# Patient Record
Sex: Female | Born: 1970 | Race: White | Hispanic: No | Smoking: Never smoker
Health system: Southern US, Community
[De-identification: ages and names within clinical notes are randomized; demographics above are authoritative.]

## PROBLEM LIST (undated history)

## (undated) DIAGNOSIS — T4145XA Adverse effect of unspecified anesthetic, initial encounter: Secondary | ICD-10-CM

## (undated) DIAGNOSIS — D219 Benign neoplasm of connective and other soft tissue, unspecified: Secondary | ICD-10-CM

## (undated) DIAGNOSIS — M353 Polymyalgia rheumatica: Secondary | ICD-10-CM

## (undated) DIAGNOSIS — R8789 Other abnormal findings in specimens from female genital organs: Secondary | ICD-10-CM

## (undated) DIAGNOSIS — T8859XA Other complications of anesthesia, initial encounter: Secondary | ICD-10-CM

## (undated) HISTORY — PX: BREAST EXCISIONAL BIOPSY: SUR124

## (undated) HISTORY — PX: AUGMENTATION MAMMAPLASTY: SUR837

## (undated) HISTORY — DX: Other abnormal findings in specimens from female genital organs: R87.89

## (undated) HISTORY — PX: OTHER SURGICAL HISTORY: SHX169

## (undated) HISTORY — DX: Benign neoplasm of connective and other soft tissue, unspecified: D21.9

## (undated) HISTORY — PX: LIPOSUCTION: SHX10

---

## 2010-07-28 ENCOUNTER — Emergency Department (HOSPITAL_COMMUNITY)
Admission: EM | Admit: 2010-07-28 | Discharge: 2010-07-29 | Payer: Self-pay | Source: Home / Self Care | Admitting: Emergency Medicine

## 2010-07-28 LAB — CBC
Hemoglobin: 12.4 g/dL (ref 12.0–15.0)
MCH: 29.3 pg (ref 26.0–34.0)
MCHC: 32.5 g/dL (ref 30.0–36.0)
MCV: 90.1 fL (ref 78.0–100.0)
Platelets: 194 10*3/uL (ref 150–400)
RDW: 12.6 % (ref 11.5–15.5)
WBC: 9.2 10*3/uL (ref 4.0–10.5)

## 2010-07-28 LAB — DIFFERENTIAL
Basophils Absolute: 0 10*3/uL (ref 0.0–0.1)
Lymphocytes Relative: 7 % — ABNORMAL LOW (ref 12–46)
Lymphs Abs: 0.7 10*3/uL (ref 0.7–4.0)
Monocytes Absolute: 0.4 10*3/uL (ref 0.1–1.0)
Neutro Abs: 8.1 10*3/uL — ABNORMAL HIGH (ref 1.7–7.7)

## 2010-07-29 LAB — COMPREHENSIVE METABOLIC PANEL
Albumin: 3.1 g/dL — ABNORMAL LOW (ref 3.5–5.2)
BUN: 10 mg/dL (ref 6–23)
Calcium: 7.9 mg/dL — ABNORMAL LOW (ref 8.4–10.5)
Creatinine, Ser: 0.76 mg/dL (ref 0.4–1.2)
Glucose, Bld: 111 mg/dL — ABNORMAL HIGH (ref 70–99)
Potassium: 3.1 mEq/L — ABNORMAL LOW (ref 3.5–5.1)
Total Protein: 6.2 g/dL (ref 6.0–8.3)

## 2010-07-29 LAB — URINALYSIS, ROUTINE W REFLEX MICROSCOPIC
Ketones, ur: >80 mg/dL — AB
Leukocytes, UA: NEGATIVE
Nitrite: NEGATIVE
Specific Gravity, Urine: 1.021 (ref 1.005–1.030)
Urine Glucose, Fasting: NEGATIVE mg/dL
pH: 6 (ref 5.0–8.0)

## 2010-07-29 LAB — LACTIC ACID, PLASMA: Lactic Acid, Venous: 0.7 mmol/L (ref 0.5–2.2)

## 2010-07-29 LAB — URINE MICROSCOPIC-ADD ON

## 2010-07-29 LAB — POCT PREGNANCY, URINE: Preg Test, Ur: NEGATIVE

## 2010-12-19 ENCOUNTER — Other Ambulatory Visit: Payer: Self-pay | Admitting: Family Medicine

## 2010-12-19 ENCOUNTER — Other Ambulatory Visit (HOSPITAL_COMMUNITY)
Admission: RE | Admit: 2010-12-19 | Discharge: 2010-12-19 | Disposition: A | Discharge status: Home / Self Care | Payer: BC Managed Care – PPO | Source: Ambulatory Visit | Attending: Family Medicine | Admitting: Family Medicine

## 2010-12-19 DIAGNOSIS — Z01419 Encounter for gynecological examination (general) (routine) without abnormal findings: Secondary | ICD-10-CM | POA: Insufficient documentation

## 2010-12-19 DIAGNOSIS — Z1231 Encounter for screening mammogram for malignant neoplasm of breast: Secondary | ICD-10-CM

## 2011-10-25 ENCOUNTER — Ambulatory Visit (INDEPENDENT_AMBULATORY_CARE_PROVIDER_SITE_OTHER): Payer: BC Managed Care – PPO | Admitting: Family Medicine

## 2011-10-25 VITALS — BP 118/80 | HR 82 | Temp 98.1°F | Resp 16 | Ht 63.5 in | Wt 145.8 lb

## 2011-10-25 DIAGNOSIS — N949 Unspecified condition associated with female genital organs and menstrual cycle: Secondary | ICD-10-CM

## 2011-10-25 DIAGNOSIS — N938 Other specified abnormal uterine and vaginal bleeding: Secondary | ICD-10-CM

## 2011-10-25 DIAGNOSIS — Z658 Other specified problems related to psychosocial circumstances: Secondary | ICD-10-CM

## 2011-10-25 DIAGNOSIS — G43829 Menstrual migraine, not intractable, without status migrainosus: Secondary | ICD-10-CM

## 2011-10-25 LAB — POCT WET PREP WITH KOH
KOH Prep POC: NEGATIVE
Yeast Wet Prep HPF POC: NEGATIVE

## 2011-10-25 NOTE — Progress Notes (Signed)
  Subjective:    Patient ID: Lori Richmond, female    DOB: [birth year], 41 y.o.   MRN: [id number]  HPI  Patient complains of irregular menstrual bleeding. Last normal menses was 4/10 which lasted 5 days . April 23rd began to spot with heavy bleeding associated with clots overnight.    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 She appears well-developed.  HENT:  Head: Normocephalic and atraumatic.  Neck: No thyromegaly present.  Cardiovascular: Normal rate, regular rhythm and normal heart sounds.   Pulmonary/Chest: Effort normal and breath sounds normal.  Abdominal: Soft. Bowel sounds are normal. She exhibits no mass.  Genitourinary: Vagina normal and uterus normal. No vaginal discharge found.  Neurological: She is alert and oriented to person, place, and time. She has normal strength. She displays normal reflexes. No cranial nerve deficit or sensory deficit. Coordination normal.    Results for orders placed in visit on 10/25/11  POCT WET PREP WITH KOH      Component Value Range   Trichomonas, UA Negative     Clue Cells Wet Prep HPF POC 0-1     Epithelial Wet Prep HPF POC 3-10     Yeast Wet Prep HPF POC neg     Bacteria Wet Prep HPF POC 2+     RBC Wet Prep HPF POC 0-4     WBC Wet Prep HPF POC 0-5     KOH Prep POC Negative          Assessment & Plan:   1. DUB (dysfunctional uterine bleeding)  POCT Wet Prep with KOH  2. Migraine, menstrual    3. Psychosocial stressors      Anticipatory guidance Supportive counseling Patient would like to  discontinue OCP upon completion of currnet pack. If irregular bleeding pattern continues patient to see her GYN

## 2011-10-28 ENCOUNTER — Encounter: Payer: Self-pay | Admitting: Family Medicine

## 2012-02-20 ENCOUNTER — Other Ambulatory Visit (HOSPITAL_COMMUNITY)
Admission: RE | Admit: 2012-02-20 | Discharge: 2012-02-20 | Disposition: A | Discharge status: Home / Self Care | Payer: BC Managed Care – PPO | Source: Ambulatory Visit | Attending: Family Medicine | Admitting: Family Medicine

## 2012-02-20 DIAGNOSIS — Z0000 Encounter for general adult medical examination without abnormal findings: Secondary | ICD-10-CM | POA: Insufficient documentation

## 2013-02-21 ENCOUNTER — Other Ambulatory Visit: Payer: Self-pay | Admitting: Family Medicine

## 2013-02-21 ENCOUNTER — Other Ambulatory Visit (HOSPITAL_COMMUNITY)
Admission: RE | Admit: 2013-02-21 | Discharge: 2013-02-21 | Disposition: A | Discharge status: Home / Self Care | Payer: BC Managed Care – PPO | Source: Ambulatory Visit | Attending: Family Medicine | Admitting: Family Medicine

## 2013-02-21 DIAGNOSIS — Z0000 Encounter for general adult medical examination without abnormal findings: Secondary | ICD-10-CM | POA: Insufficient documentation

## 2013-02-22 ENCOUNTER — Other Ambulatory Visit: Payer: Self-pay | Admitting: Family Medicine

## 2013-02-22 DIAGNOSIS — Z1231 Encounter for screening mammogram for malignant neoplasm of breast: Secondary | ICD-10-CM

## 2013-04-05 ENCOUNTER — Ambulatory Visit
Admission: RE | Admit: 2013-04-05 | Discharge: 2013-04-05 | Disposition: A | Discharge status: Home / Self Care | Payer: BC Managed Care – PPO | Source: Ambulatory Visit | Attending: Family Medicine | Admitting: Family Medicine

## 2013-04-05 DIAGNOSIS — Z1231 Encounter for screening mammogram for malignant neoplasm of breast: Secondary | ICD-10-CM

## 2013-04-19 ENCOUNTER — Other Ambulatory Visit: Payer: Self-pay | Admitting: Family

## 2013-04-19 ENCOUNTER — Other Ambulatory Visit: Payer: Self-pay | Admitting: Family Medicine

## 2013-04-19 ENCOUNTER — Ambulatory Visit
Admission: RE | Admit: 2013-04-19 | Discharge: 2013-04-19 | Disposition: A | Discharge status: Home / Self Care | Payer: BC Managed Care – PPO | Source: Ambulatory Visit | Attending: Family | Admitting: Family

## 2013-04-19 DIAGNOSIS — R928 Other abnormal and inconclusive findings on diagnostic imaging of breast: Secondary | ICD-10-CM

## 2013-04-19 DIAGNOSIS — S99921D Unspecified injury of right foot, subsequent encounter: Secondary | ICD-10-CM

## 2013-05-04 ENCOUNTER — Ambulatory Visit
Admission: RE | Admit: 2013-05-04 | Discharge: 2013-05-04 | Disposition: A | Discharge status: Home / Self Care | Payer: BC Managed Care – PPO | Source: Ambulatory Visit | Attending: Family Medicine | Admitting: Family Medicine

## 2013-05-04 DIAGNOSIS — R928 Other abnormal and inconclusive findings on diagnostic imaging of breast: Secondary | ICD-10-CM

## 2014-03-28 ENCOUNTER — Other Ambulatory Visit (HOSPITAL_COMMUNITY)
Admission: RE | Admit: 2014-03-28 | Discharge: 2014-03-28 | Disposition: A | Discharge status: Home / Self Care | Payer: BC Managed Care – PPO | Source: Ambulatory Visit | Attending: Family Medicine | Admitting: Family Medicine

## 2014-03-28 ENCOUNTER — Other Ambulatory Visit: Payer: Self-pay | Admitting: Family Medicine

## 2014-03-28 DIAGNOSIS — Z1231 Encounter for screening mammogram for malignant neoplasm of breast: Secondary | ICD-10-CM

## 2014-03-28 DIAGNOSIS — Z0000 Encounter for general adult medical examination without abnormal findings: Secondary | ICD-10-CM | POA: Insufficient documentation

## 2014-03-29 LAB — CYTOLOGY - PAP

## 2014-04-06 ENCOUNTER — Ambulatory Visit
Admission: RE | Admit: 2014-04-06 | Discharge: 2014-04-06 | Disposition: A | Discharge status: Home / Self Care | Payer: BC Managed Care – PPO | Source: Ambulatory Visit | Attending: Family Medicine | Admitting: Family Medicine

## 2014-04-06 DIAGNOSIS — Z1231 Encounter for screening mammogram for malignant neoplasm of breast: Secondary | ICD-10-CM

## 2014-04-07 ENCOUNTER — Other Ambulatory Visit: Payer: Self-pay | Admitting: Family Medicine

## 2014-04-07 DIAGNOSIS — R928 Other abnormal and inconclusive findings on diagnostic imaging of breast: Secondary | ICD-10-CM

## 2014-04-12 ENCOUNTER — Ambulatory Visit
Admission: RE | Admit: 2014-04-12 | Discharge: 2014-04-12 | Disposition: A | Discharge status: Home / Self Care | Payer: BC Managed Care – PPO | Source: Ambulatory Visit | Attending: Family Medicine | Admitting: Family Medicine

## 2014-04-12 DIAGNOSIS — R928 Other abnormal and inconclusive findings on diagnostic imaging of breast: Secondary | ICD-10-CM

## 2014-09-04 ENCOUNTER — Other Ambulatory Visit: Payer: Self-pay | Admitting: Family Medicine

## 2014-09-04 DIAGNOSIS — N6489 Other specified disorders of breast: Secondary | ICD-10-CM

## 2014-10-24 ENCOUNTER — Ambulatory Visit
Admission: RE | Admit: 2014-10-24 | Discharge: 2014-10-24 | Disposition: A | Discharge status: Home / Self Care | Payer: BC Managed Care – PPO | Source: Ambulatory Visit | Attending: Family Medicine | Admitting: Family Medicine

## 2014-10-24 DIAGNOSIS — N6489 Other specified disorders of breast: Secondary | ICD-10-CM

## 2015-03-30 ENCOUNTER — Other Ambulatory Visit: Payer: Self-pay | Admitting: Family Medicine

## 2015-03-30 ENCOUNTER — Other Ambulatory Visit (HOSPITAL_COMMUNITY)
Admission: RE | Admit: 2015-03-30 | Discharge: 2015-03-30 | Disposition: A | Discharge status: Home / Self Care | Payer: BC Managed Care – PPO | Source: Ambulatory Visit | Attending: Family Medicine | Admitting: Family Medicine

## 2015-03-30 DIAGNOSIS — Z01419 Encounter for gynecological examination (general) (routine) without abnormal findings: Secondary | ICD-10-CM | POA: Diagnosis not present

## 2015-04-02 LAB — CYTOLOGY - PAP

## 2015-09-19 ENCOUNTER — Other Ambulatory Visit: Payer: Self-pay | Admitting: Family Medicine

## 2015-09-19 DIAGNOSIS — N6489 Other specified disorders of breast: Secondary | ICD-10-CM

## 2015-10-03 ENCOUNTER — Ambulatory Visit
Admission: RE | Admit: 2015-10-03 | Discharge: 2015-10-03 | Disposition: A | Discharge status: Home / Self Care | Payer: BC Managed Care – PPO | Source: Ambulatory Visit | Attending: Family Medicine | Admitting: Family Medicine

## 2015-10-03 DIAGNOSIS — N6489 Other specified disorders of breast: Secondary | ICD-10-CM

## 2016-04-01 ENCOUNTER — Other Ambulatory Visit (HOSPITAL_COMMUNITY)
Admission: RE | Admit: 2016-04-01 | Discharge: 2016-04-01 | Disposition: A | Discharge status: Home / Self Care | Payer: BC Managed Care – PPO | Source: Ambulatory Visit | Attending: Family Medicine | Admitting: Family Medicine

## 2016-04-01 ENCOUNTER — Other Ambulatory Visit: Payer: Self-pay | Admitting: Family Medicine

## 2016-04-01 DIAGNOSIS — Z01419 Encounter for gynecological examination (general) (routine) without abnormal findings: Secondary | ICD-10-CM | POA: Insufficient documentation

## 2016-04-03 LAB — CYTOLOGY - PAP

## 2017-03-09 ENCOUNTER — Other Ambulatory Visit: Payer: Self-pay | Admitting: Obstetrics and Gynecology

## 2017-03-16 ENCOUNTER — Encounter (HOSPITAL_COMMUNITY): Payer: Self-pay

## 2017-03-20 ENCOUNTER — Ambulatory Visit (INDEPENDENT_AMBULATORY_CARE_PROVIDER_SITE_OTHER): Payer: BC Managed Care – PPO | Admitting: Gynecology

## 2017-03-20 ENCOUNTER — Encounter: Payer: Self-pay | Admitting: Gynecology

## 2017-03-20 VITALS — BP 122/80 | Ht 63.0 in | Wt 140.0 lb

## 2017-03-20 DIAGNOSIS — D259 Leiomyoma of uterus, unspecified: Secondary | ICD-10-CM

## 2017-03-20 DIAGNOSIS — N926 Irregular menstruation, unspecified: Secondary | ICD-10-CM

## 2017-03-20 NOTE — Progress Notes (Signed)
    Lori Richmond 1970-09-27 [id number]        46 y.o.  G1P1 new patient presents noting heavier menses over the past 6 months or so. She has been on oral contraceptives for years with regular relatively light menses. They got heavier with longer flow over the last 6 months and then over the last several months she has started to have intermenstrual bleeding at times being heavy. She was evaluated by another physician in town to include a normal hemoglobin at 13, normal FSH/TSH, endometrial biopsy showing inactive endometrium and an ultrasound showing the uterus enlarged 9 x 6 x 6 cm with endometrial echo 7.2 mm and multiple myomas 7 measured the largest 4.3 cm. They noted the endometrium to be thickened with a questionable hypoechoic mass 0.6 x 0.5 cm and a questionable hyperechoic mass 0.6 x 0.6 x 0.6 cm. Both ovaries were noted to be normal. She was never told she had leiomyoma in the past. Status post vaginal birth 1. Not interested in further pregnancies. Options for management were reviewed and the patient initially was scheduled for hysteroscopy/endometrial ablation which she ultimately canceled and she wants to discuss her situation and alternative options.  Past medical history,surgical history, problem list, medications, allergies, family history and social history were all reviewed and documented in the EPIC chart.  Directed ROS with pertinent positives and negatives documented in the history of present illness/assessment and plan.  Exam: [name] assistant Vitals:   03/20/17 1559  BP: 122/80  Weight: 140 lb (63.5 kg)  Height: _0  (1.6 m)   General appearance:  Normal Abdomen soft nontender without masses guarding rebound External BUS vagina with menses flow. Cervix normal. Uterus bulky irregular consistent with leiomyoma. Adnexa without gross masses or tenderness.  Assessment/Plan:  46 y.o. G1P1 with history as above. Worsening menses of the last 6 months or so with now  intermenstrual bleeding. Ultrasound shows multiple small myomas throughout the uterus. Questionable endometrial defects. Endometrial biopsy showing inactive endometrium consistent with her oral contraceptives. Reviewed with the patient extensively the options for management and further evaluation. I recommended we start with sonohysterogram for better cavitary definition. If endometrial polyps or submucous myomas, options for hysteroscopic resection discussed. Medical management to include hormonal manipulation such as progesterone, Depo-Lupron, Mirena IUD all reviewed. Uterine artery embolization was discussed although with multiple small myomas are not sure she is a great candidate for this. Endometrial ablation was also reviewed but I did discuss higher failure rates in patients with multiple myomas. Surgical options to include myomectomy versus hysterectomy discussed. As childbearing is not an issue I believe hysterectomy is a choice for definitive control of her bleeding and prevention of recurrence of her leiomyoma. Laparoscopic/robotic versus vaginal versus abdominal approach as discussed. Possible preoperative Depo-Lupron to help shrink the uterus and maximize success with a vaginal approach. Patient at this point is interested in discussing the Mirena IUD as a more conservative approach. Recommended we start with sonohysterogram she's going to schedule this follow up with me.  Greater than 50% of my 45 minute visit was spent in reviewing the other physician's records that were brought with her and in direct face to face counseling and coordination of care with the patient.     Anastasio Auerbach MD, 4:50 PM 03/20/2017

## 2017-03-20 NOTE — Patient Instructions (Signed)
Follow up for ultrasound as scheduled 

## 2017-03-23 ENCOUNTER — Encounter: Payer: Self-pay | Admitting: Gynecology

## 2017-03-23 ENCOUNTER — Telehealth: Payer: Self-pay | Admitting: *Deleted

## 2017-03-23 NOTE — Telephone Encounter (Signed)
-----   Message from Bishop Limbo sent at 03/20/2017  4:55 PM EDT ----- Would you please ask TF about this pt scheduling on shgm. Neither she or her husband have permanent sterilization and she wants it done ASAP and doesn't want to wait for her next cycle in October to schedule. I told her we would let her know what he says in regards to this. Thanks!

## 2017-03-23 NOTE — Telephone Encounter (Signed)
I would follow our regular protocol. This is not an emergency and she can do it during the first half of her next cycle.

## 2017-03-23 NOTE — Telephone Encounter (Signed)
Pt aware, will have front desk to schedule.

## 2017-03-30 ENCOUNTER — Ambulatory Visit (HOSPITAL_COMMUNITY)
Admission: RE | Admit: 2017-03-30 | Payer: BC Managed Care – PPO | Source: Ambulatory Visit | Admitting: Obstetrics and Gynecology

## 2017-03-30 ENCOUNTER — Encounter (HOSPITAL_COMMUNITY): Admission: RE | Payer: Self-pay | Source: Ambulatory Visit

## 2017-03-30 SURGERY — DILATATION & CURETTAGE/HYSTEROSCOPY WITH HYDROTHERMAL ABLATION
Anesthesia: Choice

## 2017-04-07 ENCOUNTER — Telehealth: Payer: Self-pay | Admitting: *Deleted

## 2017-04-07 NOTE — Telephone Encounter (Signed)
Patient has [facility] scheduled on 04/22/17 c/o only spotting now, concerned if bleeding becomes heavy what to do? I explained if it does become heavier to call and I will let Dr.Fontaine know and he may want to prescribe medication to slow down bleeding. Pt verbalized she understood.

## 2017-04-08 ENCOUNTER — Other Ambulatory Visit: Payer: Self-pay | Admitting: Gynecology

## 2017-04-08 DIAGNOSIS — N939 Abnormal uterine and vaginal bleeding, unspecified: Secondary | ICD-10-CM

## 2017-04-08 DIAGNOSIS — N852 Hypertrophy of uterus: Secondary | ICD-10-CM

## 2017-04-08 DIAGNOSIS — D251 Intramural leiomyoma of uterus: Secondary | ICD-10-CM

## 2017-04-14 ENCOUNTER — Telehealth: Payer: Self-pay | Admitting: *Deleted

## 2017-04-14 MED ORDER — MEGESTROL ACETATE 40 MG PO TABS: 40.0000 mg | ORAL_TABLET | Freq: Two times a day (BID) | ORAL | 0 refills | Status: DC

## 2017-04-14 NOTE — Telephone Encounter (Signed)
Megace 40 mg twice daily until sonohysterogram, #20 with no refills. Have her call if no relief of bleeding.

## 2017-04-14 NOTE — Telephone Encounter (Signed)
Dr.Fontaine patient) pt scheduled for [facility] on 04/22/17 has been spotting, now bleeding has increased heavier with clots, pt asked if Rx could be sent to pharmacy to help slow bleeding down? Please advise

## 2017-04-14 NOTE — Telephone Encounter (Signed)
Pt informed, Rx sent. 

## 2017-04-22 ENCOUNTER — Encounter: Payer: Self-pay | Admitting: Gynecology

## 2017-04-22 ENCOUNTER — Ambulatory Visit (INDEPENDENT_AMBULATORY_CARE_PROVIDER_SITE_OTHER): Payer: BC Managed Care – PPO | Admitting: Gynecology

## 2017-04-22 ENCOUNTER — Ambulatory Visit (INDEPENDENT_AMBULATORY_CARE_PROVIDER_SITE_OTHER): Payer: BC Managed Care – PPO

## 2017-04-22 VITALS — BP 134/84

## 2017-04-22 DIAGNOSIS — N852 Hypertrophy of uterus: Secondary | ICD-10-CM

## 2017-04-22 DIAGNOSIS — D259 Leiomyoma of uterus, unspecified: Secondary | ICD-10-CM

## 2017-04-22 DIAGNOSIS — N926 Irregular menstruation, unspecified: Secondary | ICD-10-CM

## 2017-04-22 DIAGNOSIS — N840 Polyp of corpus uteri: Secondary | ICD-10-CM

## 2017-04-22 DIAGNOSIS — N939 Abnormal uterine and vaginal bleeding, unspecified: Secondary | ICD-10-CM

## 2017-04-22 DIAGNOSIS — N921 Excessive and frequent menstruation with irregular cycle: Secondary | ICD-10-CM

## 2017-04-22 DIAGNOSIS — D251 Intramural leiomyoma of uterus: Secondary | ICD-10-CM | POA: Diagnosis not present

## 2017-04-22 NOTE — Patient Instructions (Signed)
Office will call you with biopsy results.  Office will call you to schedule the hysteroscopy D&C 

## 2017-04-22 NOTE — Progress Notes (Signed)
    Lori Richmond 12-Mar-1971 [id number]        46 y.o.  G1P1 presents for sonohysterogram.  History of accelerating menses over the past 6 months now with intermenstrual bleeding requiring Megace to control.  Was evaluated elsewhere with a hemoglobin of 13, normal FSH and TSH with biopsy showing inactive endometrium.  Ultrasound showed multiple myomas with endometrial echo 7.2 mm.  Questionable hypoechoic and hyperechoic mass within the endometrium.  Various options for her irregular bleeding and heavy menses were reviewed with her as noted in my 03/20/2017 note.  Past medical history,surgical history, problem list, medications, allergies, family history and social history were all reviewed and documented in the EPIC chart.  Directed ROS with pertinent positives and negatives documented in the history of present illness/assessment and plan.  Exam: Pam Falls assistant Vitals:   04/22/17 1240  BP: 134/84   General appearance:  Normal Abdomen soft nontender without masses guarding rebound Pelvic external BUS vagina normal.  Cervix grossly normal.  Uterus bulky consistent with leiomyoma.  Adnexa without gross masses  Ultrasound transvaginal and transabdominal shows enlarged uterus approximately 12 weeks size with multiple myomas measuring 57 mm, 39 mm, 31 mm, 28 mm, 23 mm, 23 mm, 19 mm, 15 mm.  Endometrial echo 5.8 mm.  Right ovary with 2 small echo-free cysts 22 mm and 14 mm.  Left ovary grossly normal.  Cul-de-sac with small amount of fluid 16 mm x 12 mm.  Sonohysterogram performed, sterile technique, easy catheter introduction, good distention with fundal defect 14 x 12 mm consistent with endometrial polyp or small submucous myoma.  Endometrial sample taken.  Patient tolerated well.  Assessment/Plan:  46 y.o. G1P1 with accelerating menses and now irregular bleeding in between.  Multiple myomas.  Sonohysterogram consistent with small endometrial polyp.  I again reviewed options for management.   Patient would prefer conservative approach.  Recommended hysteroscopy D&C with resection of the endometrial polyp and any intracavitary defects.  She understands that we are not removing her myomas although can resect those protruding into the cavity to the level of the surrounding endometrium.  No guarantees as far as menstrual pattern afterwards discussed.  Possibilities to allow for IUD placement if cavity otherwise smooth and fairly regular.  Continued hormonal manipulation, endometrial ablation, uterine artery embolization, myomectomy and hysterectomy possibilities have all been discussed.  Patient will follow-up for the biopsy results and to schedule a hysteroscopy D&C and then will go from there.    Anastasio Auerbach MD, 12:58 PM 04/22/2017

## 2017-04-23 ENCOUNTER — Telehealth: Payer: Self-pay

## 2017-04-23 ENCOUNTER — Other Ambulatory Visit: Payer: Self-pay

## 2017-04-23 MED ORDER — MISOPROSTOL 200 MCG PO TABS: ORAL_TABLET | ORAL | 0 refills | Status: DC

## 2017-04-23 NOTE — Telephone Encounter (Signed)
I called patient and discussed her ins benefits with her and her estimated surgery prepayment due. Financial letter will be sent to her as well as a pamphlet from [facility].  She reviewed her schedule and checked with her family and decided that Tuesday Dec 18 would be best day for her.  I scheduled her at [facility] for 7:30am that morning and advised her that check in is 2 hours early and will need someone to drive her home and be with her 24 hours.  I advised patient regarding need for Cytotec tab to be inserted vaginally hs before surgery and that Rx was sent to her pharmacy.

## 2017-04-28 ENCOUNTER — Encounter: Payer: Self-pay | Admitting: Gynecology

## 2017-05-04 ENCOUNTER — Encounter: Payer: Self-pay | Admitting: Physician Assistant

## 2017-05-04 ENCOUNTER — Ambulatory Visit: Payer: BC Managed Care – PPO | Admitting: Physician Assistant

## 2017-05-04 ENCOUNTER — Telehealth: Payer: Self-pay | Admitting: *Deleted

## 2017-05-04 VITALS — BP 130/86 | HR 97 | Temp 98.9°F | Resp 16 | Ht 63.0 in | Wt 138.4 lb

## 2017-05-04 DIAGNOSIS — Z8269 Family history of other diseases of the musculoskeletal system and connective tissue: Secondary | ICD-10-CM | POA: Diagnosis not present

## 2017-05-04 DIAGNOSIS — M2550 Pain in unspecified joint: Secondary | ICD-10-CM

## 2017-05-04 MED ORDER — CEPHALEXIN 500 MG PO CAPS
500.0000 mg | ORAL_CAPSULE | Freq: Two times a day (BID) | ORAL | 0 refills | Status: DC
Start: 2017-05-04 — End: 2017-05-04

## 2017-05-04 NOTE — Progress Notes (Addendum)
Lori Richmond  MRN: [id number] DOB: 14-Jul-1970  PCP: Antony Blackbird, MD (Inactive)  Subjective:  Pt is a 46 year old female who presents to clinic for pain x several weeks.  Pain is located in finger, ankle, hips, shoulders, neck.  Sometimes she can hardly walk.  Last night neck pain and sternum.  Pain lasts all day.  Endorses feeling exhausted.  FHx: muscle arthritis. Polymyalgia rheumatica.  Symptoms are worse with weather changes.  She has had normal thyroid tests.  She is taking tylenol for pain.   Dec 18 - fibroid removal surgery  Review of Systems  Constitutional: Negative for chills, diaphoresis, fatigue and fever.  Musculoskeletal: Positive for arthralgias and gait problem.  Neurological: Negative for dizziness, weakness, light-headedness and headaches.    There are no active problems to display for this patient.   Current Outpatient Medications on File Prior to Visit  Medication Sig Dispense Refill  . cholecalciferol (VITAMIN D) 400 UNITS TABS tablet Take 400 Units by mouth.    . IRON PO Take by mouth.    . medroxyPROGESTERone (PROVERA) 10 MG tablet Take 10 mg by mouth daily.    . misoprostol (CYTOTEC) 200 MCG tablet Insert tab VAGINALLY hs before surgery. 1 tablet 0  . Norgestim-Eth Estrad Triphasic (ORTHO TRI-CYCLEN, 28, PO) Take by mouth.    . megestrol (MEGACE) 40 MG tablet Take 1 tablet (40 mg total) by mouth 2 (two) times daily. 20 tablet 0   No current facility-administered medications on file prior to visit.     No Known Allergies   Objective:  BP 130/86   Pulse 97   Temp 98.9 F (37.2 C) (Oral)   Resp 16   Ht 5' 3"  (1.6 m)   Wt 138 lb 6.4 oz (62.8 kg)   LMP 05/01/2017 Comment: AUB  SpO2 96%   BMI 24.52 kg/m   Physical Exam  Constitutional: She is oriented to person, place, and time and well-developed, well-nourished, and in no distress. No distress.  Cardiovascular: Normal rate, regular rhythm and normal heart sounds.  Musculoskeletal:    Right shoulder: She exhibits normal range of motion, no tenderness and no bony tenderness.       Left shoulder: She exhibits normal range of motion, no tenderness and no bony tenderness.       Right hip: She exhibits normal range of motion, normal strength, no tenderness and no bony tenderness.       Left hip: She exhibits normal range of motion, normal strength, no tenderness and no bony tenderness.       Right knee: She exhibits normal range of motion, no swelling and no effusion. No tenderness found.       Left knee: She exhibits normal range of motion, no swelling and no effusion. No tenderness found.  Neurological: She is alert and oriented to person, place, and time. GCS score is 15.  Skin: Skin is warm and dry. No rash noted. No erythema.  Psychiatric: Mood, memory, affect and judgment normal.  Vitals reviewed.   Assessment and Plan :  1. Arthralgia, unspecified joint 2. 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 Labs are pending. Will contact with results and plan. She is interested in rheumatology referral.    Mercer Pod, PA-C  [facility] [place]  05/04/2017 4:26 PM

## 2017-05-04 NOTE — Telephone Encounter (Signed)
Patient called requesting refill to [facility]. Due to possible arthritis. I called pt back and informed her that she will need to become established with PCP and they can refer her. I gave her the name and number of primary care at [facility] to call.

## 2017-05-04 NOTE — Patient Instructions (Addendum)
We will be in contact with you about your lab results.   Thank you for coming in today. I hope you feel we met your needs.  Feel free to call PCP if you have any questions or further requests.  Please consider signing up for MyChart if you do not already have it, as this is a great way to communicate with me.  Best,  [employer], PA-C

## 2017-05-05 LAB — CBC WITH DIFFERENTIAL/PLATELET
Basophils Absolute: 0 10*3/uL (ref 0.0–0.2)
Basos: 0 %
EOS (ABSOLUTE): 0.1 10*3/uL (ref 0.0–0.4)
Eos: 1 %
Hematocrit: 36.8 % (ref 34.0–46.6)
Hemoglobin: 12.5 g/dL (ref 11.1–15.9)
Immature Grans (Abs): 0 10*3/uL (ref 0.0–0.1)
Immature Granulocytes: 0 %
Lymphocytes Absolute: 2.2 10*3/uL (ref 0.7–3.1)
Lymphs: 28 %
MCH: 30.8 pg (ref 26.6–33.0)
MCHC: 34 g/dL (ref 31.5–35.7)
MCV: 91 fL (ref 79–97)
Monocytes Absolute: 0.7 10*3/uL (ref 0.1–0.9)
Monocytes: 9 %
Neutrophils Absolute: 4.9 10*3/uL (ref 1.4–7.0)
Neutrophils: 62 %
Platelets: 306 10*3/uL (ref 150–379)
RBC: 4.06 x10E6/uL (ref 3.77–5.28)
RDW: 13.2 % (ref 12.3–15.4)
WBC: 8 10*3/uL (ref 3.4–10.8)

## 2017-05-05 LAB — RHEUMATOID FACTOR: Rheumatoid fact SerPl-aCnc: 13.6 [IU]/mL (ref 0.0–13.9)

## 2017-05-05 LAB — CMP14+EGFR
ALT: 7 IU/L (ref 0–32)
AST: 15 IU/L (ref 0–40)
Albumin/Globulin Ratio: 1.4 (ref 1.2–2.2)
Albumin: 4.2 g/dL (ref 3.5–5.5)
Alkaline Phosphatase: 48 IU/L (ref 39–117)
BUN/Creatinine Ratio: 14 (ref 9–23)
BUN: 8 mg/dL (ref 6–24)
Bilirubin Total: 0.3 mg/dL (ref 0.0–1.2)
CO2: 22 mmol/L (ref 20–29)
Calcium: 9.2 mg/dL (ref 8.7–10.2)
Chloride: 99 mmol/L (ref 96–106)
Creatinine, Ser: 0.59 mg/dL (ref 0.57–1.00)
GFR calc Af Amer: 129 mL/min/{1.73_m2} (ref >59)
GFR calc non Af Amer: 112 mL/min/{1.73_m2} (ref >59)
Globulin, Total: 3.1 g/dL (ref 1.5–4.5)
Glucose: 84 mg/dL (ref 65–99)
Potassium: 4 mmol/L (ref 3.5–5.2)
Sodium: 137 mmol/L (ref 134–144)
Total Protein: 7.3 g/dL (ref 6.0–8.5)

## 2017-05-05 LAB — SEDIMENTATION RATE: Sed Rate: 45 mm/hr — ABNORMAL HIGH (ref 0–32)

## 2017-05-05 LAB — C-REACTIVE PROTEIN: CRP: 115.8 mg/L — ABNORMAL HIGH (ref 0.0–4.9)

## 2017-05-05 LAB — ANA W/REFLEX IF POSITIVE: Anti Nuclear Antibody(ANA): NEGATIVE

## 2017-05-05 LAB — VITAMIN D 25 HYDROXY (VIT D DEFICIENCY, FRACTURES): Vit D, 25-Hydroxy: 30.8 ng/mL (ref 30.0–100.0)

## 2017-05-05 LAB — CK: Total CK: 91 U/L (ref 24–173)

## 2017-05-07 ENCOUNTER — Telehealth: Payer: Self-pay | Admitting: Physician Assistant

## 2017-05-07 NOTE — Telephone Encounter (Signed)
Copied from [place] [id number]. Topic: Quick Communication - See Telephone Encounter >> May 07, 2017  4:20 PM Boyd Kerbs wrote: CRM for notification. See Telephone encounter for:   Labs were drawn on Monday and don't show any results. Please call and let know results.  Did not see any results in her chart or CRM   05/07/17.

## 2017-05-08 ENCOUNTER — Ambulatory Visit: Payer: Self-pay | Admitting: *Deleted

## 2017-05-08 NOTE — Telephone Encounter (Signed)
Pt called in for lab results.   They have not been released to [facility] pool.   She has called already requesting her lab results that were drawn on Monday.   She  Needs the results in regards to a referral to a specialist.  She has asked to see Cy Blamer at [facility].  She stated,  "It is ok to leave a very detailed voicemail on her cell phone"   I called [facility] lead line 3 times attempting to touch base with the clinic leader for today without success.   I routed a high priority note to the nurse pool with the information she gave me (which is included in this note).

## 2017-05-11 ENCOUNTER — Telehealth: Payer: Self-pay | Admitting: *Deleted

## 2017-05-11 MED ORDER — MEGESTROL ACETATE 20 MG PO TABS: ORAL_TABLET | ORAL | 1 refills | Status: DC

## 2017-05-11 NOTE — Telephone Encounter (Signed)
Pt said no pregnancy is not a possibility, Rx sent.

## 2017-05-11 NOTE — Telephone Encounter (Signed)
Please review

## 2017-05-11 NOTE — Telephone Encounter (Signed)
Okay for Megace 20 mg twice daily until bleeding slows then daily times 1 week.  May repeat as needed.  Make sure pregnancy is not a possibility

## 2017-05-11 NOTE — Telephone Encounter (Signed)
Pt called asking if refill on megace could be prescribed states bleeding has increase with clots, surgery scheduled on 06/16/17. Megace last prescribed in Oct. Please advise

## 2017-05-11 NOTE — Telephone Encounter (Unsigned)
Copied from [place] [id number]. Topic: Quick Communication - See Telephone Encounter >> May 07, 2017  4:20 PM Boyd Kerbs wrote: CRM for notification. See Telephone encounter for:   Labs were drawn on Monday and my chart doesn't show any results. Please call and let know results.  Also, if results not normal related to Rheumatoid Artristis, pt ask to send results to [place]. She will also need referral.  05/07/17. >> May 08, 2017  1:50 PM Sandi Mariscal E, NT wrote: Pt. Called back about test results and a referral. Pt said she is in pain and can't make an appointment until she results.

## 2017-05-12 ENCOUNTER — Other Ambulatory Visit: Payer: Self-pay | Admitting: Physician Assistant

## 2017-05-12 ENCOUNTER — Encounter: Payer: Self-pay | Admitting: Physician Assistant

## 2017-05-12 ENCOUNTER — Telehealth: Payer: Self-pay | Admitting: Physician Assistant

## 2017-05-12 DIAGNOSIS — R7982 Elevated C-reactive protein (CRP): Secondary | ICD-10-CM

## 2017-05-12 DIAGNOSIS — Z8269 Family history of other diseases of the musculoskeletal system and connective tissue: Secondary | ICD-10-CM | POA: Insufficient documentation

## 2017-05-12 NOTE — Telephone Encounter (Signed)
Pt would like a referral for Rheumatology   Please advise

## 2017-05-12 NOTE — Progress Notes (Signed)
MyChart message sent. Plan to treat with Prednisone vs rheumatology referral. Will wait to hear patient preference.

## 2017-05-12 NOTE — Telephone Encounter (Signed)
Pt c/o of not hearing back with a referral for a rheumatologist. She states she is in pain and needs to see one. Skype message sent to flow coordinator.

## 2017-05-12 NOTE — Progress Notes (Signed)
Elevated CRP and sed rate. Plan to refer to rheum

## 2017-05-12 NOTE — Telephone Encounter (Signed)
Left VM and MyChart message.

## 2017-05-14 ENCOUNTER — Telehealth: Payer: Self-pay

## 2017-05-14 NOTE — Telephone Encounter (Signed)
Referral sent. Left vm for pt letting her know this and provided [facility] phone number.

## 2017-05-14 NOTE — Telephone Encounter (Signed)
Copied from [place] [id number]. Topic: Referral - Status >> May 14, 2017  1:34 PM Aurelio Brash B wrote: Reason for CRM:  Pt called about her referral  to  [facility] rheumatology,  [facility] rheumatology told pt they did not  receive the referral request.  Pt would like a call back

## 2017-06-04 ENCOUNTER — Telehealth: Payer: Self-pay | Admitting: *Deleted

## 2017-06-04 MED ORDER — MEGESTROL ACETATE 20 MG PO TABS: ORAL_TABLET | ORAL | 0 refills | Status: DC

## 2017-06-04 NOTE — Telephone Encounter (Signed)
Okay to refill Megace

## 2017-06-04 NOTE — Telephone Encounter (Signed)
Pt informed, Rx sent. 

## 2017-06-04 NOTE — Telephone Encounter (Signed)
Patient called requesting refill on megace 20 mg due to bleeding, pt is scheduled for surgery 06/16/17 for D&C. Please advise

## 2017-06-09 ENCOUNTER — Ambulatory Visit (INDEPENDENT_AMBULATORY_CARE_PROVIDER_SITE_OTHER): Payer: BC Managed Care – PPO | Admitting: Gynecology

## 2017-06-09 ENCOUNTER — Encounter: Payer: Self-pay | Admitting: Gynecology

## 2017-06-09 VITALS — BP 116/70

## 2017-06-09 DIAGNOSIS — N920 Excessive and frequent menstruation with regular cycle: Secondary | ICD-10-CM

## 2017-06-09 DIAGNOSIS — D259 Leiomyoma of uterus, unspecified: Secondary | ICD-10-CM

## 2017-06-09 DIAGNOSIS — N840 Polyp of corpus uteri: Secondary | ICD-10-CM | POA: Diagnosis not present

## 2017-06-09 NOTE — Patient Instructions (Addendum)
Followup for surgery as scheduled. 

## 2017-06-09 NOTE — H&P (Signed)
Lori Richmond Nov 28, 1970 [id number]   History and Physical  Chief complaint: Menorrhagia with intermenstrual bleeding.   History of present illness: 46 y.o. G1P1 with a history of accelerating menorrhagia over the past 6 months.  Hemoglobin, FSH and TSH were normal.  Exam consistent with leiomyoma.  Sonohysterogram showed enlarged uterus approximately 12 weeks size with multiple myomas.  Endometrial cavity showed fundal defect 12 x 14 mm consistent with endometrial polyp or small submucosal myoma.  Endometrial biopsy benign.  Options for management were reviewed and the patient elects for hysteroscopic resection of the endometrial defect.  Past Medical History:  Diagnosis Date  . Fibroid     Past Surgical History:  Procedure Laterality Date  . AUGMENTATION MAMMAPLASTY    . LIPOSUCTION    . Tummy tuck      Family History  Problem Relation Age of Onset  . Hypertension Father   . Heart attack Father     Social History:  reports that  has never smoked. she has never used smokeless tobacco. She reports that she drinks alcohol. She reports that she does not use drugs.  Allergies: No Known Allergies  Medications: Per Epic record  ROS:  Was performed and pertinent positives and negatives are included in the history of present illness.  Exam: Lori Richmond assistant Vitals:   06/09/17 1206  BP: 116/70   General: well developed, well nourished female, no acute distress HEENT: normal  Lungs: clear to auscultation without wheezing, rales or rhonchi  Cardiac: regular rate without rubs, murmurs or gallops  Abdomen: soft, nontender without masses, guarding, rebound, organomegaly  Pelvic: external bus vagina: normal   Cervix: grossly normal  Uterus: bulky, midline mobile nontender consistent with leiomyoma Adnexa: without masses or tenderness   Assessment/Plan:  46 y.o. G1P1 46 y.o. G1P1 with accelerating menorrhagia and intermenstrual bleeding.  Exam and ultrasound consistent with  multiple myomas.  Sonohysterogram shows endometrial defect consistent with submucous myoma versus endometrial polyp.  Atrial biopsy benign.  Options for management were reviewed with the patient up to and including hysterectomy.  Patient prefers conservative approach with resection of the endometrial defects in an attempt to lighten her menses.  We have also discussed possible postoperative management to include the Mirena IUD as well as hormonal manipulation, endometrial ablation, uterine artery embolization, myomectomy and hysterectomy.  I reviewed the proposed surgery with the patient to include the expected intraoperative and postoperative courses as well as the recovery period. The use of the hysteroscope, resectoscope and the D&C portion were all discussed. The risks of surgery to include infection, prolonged antibiotics, hemorrhage necessitating transfusion and the risks of transfusion, including transfusion reaction, hepatitis, HIV, mad cow disease and other unknown entities were all discussed understood and accepted. The risk of damage to internal organs during the procedure, either immediately recognized or delay recognized, including vagina, cervix, uterus, possible perforation causing damage to bowel, bladder, ureters, vessels and nerves necessitating major exploratory reparative surgery and future reparative surgeries including bladder repair, ureteral damage repair, bowel resection, ostomy formation was also discussed understood and accepted. She understands we are not removing all of her myomas but only the portions that protrude into the endometrial cavity.  There are no guarantees that this will relieve her menorrhagia and her periods may continue heavy or get worse. The patient's questions were answered to her satisfaction and she is ready to proceed with surgery.     Anastasio Auerbach MD, 12:33 PM 06/09/2017

## 2017-06-09 NOTE — Progress Notes (Signed)
[facility] [birth year] [id number]   Preoperative consult  Chief complaint: Menorrhagia with intermenstrual bleeding.   History of present illness: 46 y.o. G1P1 with a history of accelerating menorrhagia over the past 6 months.  Hemoglobin, FSH and TSH were normal.  Exam consistent with leiomyoma.  Sonohysterogram showed enlarged uterus approximately 12 weeks size with multiple myomas.  Endometrial cavity showed fundal defect 12 x 14 mm consistent with endometrial polyp or small submucosal myoma.  Endometrial biopsy benign.  Options for management were reviewed and the patient elects for hysteroscopic resection of the endometrial defect.  Past medical history,surgical history, medications, allergies, family history and social history were all reviewed and documented in the EPIC chart.  ROS:  Was performed and pertinent positives and negatives are included in the history of present illness.  Exam: [name] assistant Vitals:   06/09/17 1206  BP: 116/70   General: well developed, well nourished female, no acute distress HEENT: normal  Lungs: clear to auscultation without wheezing, rales or rhonchi  Cardiac: regular rate without rubs, murmurs or gallops  Abdomen: soft, nontender without masses, guarding, rebound, organomegaly  Pelvic: external bus vagina: normal   Cervix: grossly normal  Uterus: bulky, midline mobile nontender consistent with leiomyoma Adnexa: without masses or tenderness     Assessment/Plan:  46 y.o. G1P1 with accelerating menorrhagia and intermenstrual bleeding.  Exam and ultrasound consistent with multiple myomas.  Sonohysterogram shows endometrial defect consistent with submucous myoma versus endometrial polyp.  Atrial biopsy benign.  Options for management were reviewed with the patient up to and including hysterectomy.  Patient prefers conservative approach with resection of the endometrial defects in an attempt to lighten her menses.  We have also discussed possible  postoperative management to include the Mirena IUD as well as hormonal manipulation, endometrial ablation, uterine artery embolization, myomectomy and hysterectomy.  I reviewed the proposed surgery with the patient to include the expected intraoperative and postoperative courses as well as the recovery period. The use of the hysteroscope, resectoscope and the D&C portion were all discussed. The risks of surgery to include infection, prolonged antibiotics, hemorrhage necessitating transfusion and the risks of transfusion, including transfusion reaction, hepatitis, HIV, mad cow disease and other unknown entities were all discussed understood and accepted. The risk of damage to internal organs during the procedure, either immediately recognized or delay recognized, including vagina, cervix, uterus, possible perforation causing damage to bowel, bladder, ureters, vessels and nerves necessitating major exploratory reparative surgery and future reparative surgeries including bladder repair, ureteral damage repair, bowel resection, ostomy formation was also discussed understood and accepted. She understands we are not removing all of her myomas but only the portions that protrude into the endometrial cavity.  There are no guarantees that this will relieve her menorrhagia and her periods may continue heavy or get worse. The patient's questions were answered to her satisfaction and she is ready to proceed with surgery.    Anastasio Auerbach MD, 12:26 PM 06/09/2017

## 2017-06-11 ENCOUNTER — Encounter (HOSPITAL_BASED_OUTPATIENT_CLINIC_OR_DEPARTMENT_OTHER): Payer: Self-pay

## 2017-06-11 ENCOUNTER — Other Ambulatory Visit: Payer: Self-pay

## 2017-06-11 NOTE — Progress Notes (Signed)
Spoke with Lori Richmond NPO after midnight, no gum, candy, or mints.  Arrival time 0530 AM DOS.  Lab appointment 06/15/17 @ 11AM for CBC and CMP.  No medications will be taken the AM DOS.  Pre op orders are in epic.  Olen Cordial (daughter) will be ride home DOS [phone].

## 2017-06-15 ENCOUNTER — Encounter (HOSPITAL_COMMUNITY)
Admission: RE | Admit: 2017-06-15 | Discharge: 2017-06-15 | Disposition: A | Discharge status: Home / Self Care | Payer: BC Managed Care – PPO | Source: Ambulatory Visit | Attending: Gynecology | Admitting: Gynecology

## 2017-06-15 DIAGNOSIS — Z79899 Other long term (current) drug therapy: Secondary | ICD-10-CM | POA: Diagnosis not present

## 2017-06-15 DIAGNOSIS — D250 Submucous leiomyoma of uterus: Secondary | ICD-10-CM | POA: Diagnosis not present

## 2017-06-15 DIAGNOSIS — N920 Excessive and frequent menstruation with regular cycle: Secondary | ICD-10-CM | POA: Diagnosis present

## 2017-06-15 DIAGNOSIS — Z8249 Family history of ischemic heart disease and other diseases of the circulatory system: Secondary | ICD-10-CM | POA: Diagnosis not present

## 2017-06-15 DIAGNOSIS — N926 Irregular menstruation, unspecified: Secondary | ICD-10-CM | POA: Diagnosis not present

## 2017-06-15 DIAGNOSIS — N840 Polyp of corpus uteri: Secondary | ICD-10-CM | POA: Diagnosis not present

## 2017-06-15 DIAGNOSIS — N923 Ovulation bleeding: Secondary | ICD-10-CM | POA: Diagnosis not present

## 2017-06-15 LAB — COMPREHENSIVE METABOLIC PANEL
ALBUMIN: 4 g/dL (ref 3.5–5.0)
ALK PHOS: 38 U/L (ref 38–126)
ALT: 12 U/L — AB (ref 14–54)
AST: 21 U/L (ref 15–41)
Anion gap: 6 (ref 5–15)
BUN: 15 mg/dL (ref 6–20)
CALCIUM: 9.3 mg/dL (ref 8.9–10.3)
CHLORIDE: 104 mmol/L (ref 101–111)
CO2: 24 mmol/L (ref 22–32)
CREATININE: 0.57 mg/dL (ref 0.44–1.00)
GFR calc Af Amer: >60 mL/min (ref >60)
GFR calc non Af Amer: >60 mL/min (ref >60)
GLUCOSE: 103 mg/dL — AB (ref 65–99)
Potassium: 4.5 mmol/L (ref 3.5–5.1)
SODIUM: 134 mmol/L — AB (ref 135–145)
Total Bilirubin: 0.9 mg/dL (ref 0.3–1.2)
Total Protein: 7.5 g/dL (ref 6.5–8.1)

## 2017-06-15 LAB — CBC
HCT: 40.7 % (ref 36.0–46.0)
HEMOGLOBIN: 13.5 g/dL (ref 12.0–15.0)
MCH: 30.7 pg (ref 26.0–34.0)
MCHC: 33.2 g/dL (ref 30.0–36.0)
MCV: 92.5 fL (ref 78.0–100.0)
PLATELETS: 315 10*3/uL (ref 150–400)
RBC: 4.4 MIL/uL (ref 3.87–5.11)
RDW: 12.9 % (ref 11.5–15.5)
WBC: 6.1 10*3/uL (ref 4.0–10.5)

## 2017-06-15 NOTE — Anesthesia Preprocedure Evaluation (Signed)
Anesthesia Evaluation  Patient identified by MRN, date of birth, ID band Patient awake    Reviewed: Allergy & Precautions, H&P , Patient's Chart, lab work & pertinent test results, reviewed documented beta blocker date and time   Airway Mallampati: II  TM Distance: >3 FB Neck ROM: full    Dental no notable dental hx.    Pulmonary    Pulmonary exam normal breath sounds clear to auscultation       Cardiovascular  Rhythm:regular Rate:Normal     Neuro/Psych    GI/Hepatic   Endo/Other    Renal/GU      Musculoskeletal   Abdominal   Peds  Hematology   Anesthesia Other Findings   Reproductive/Obstetrics                             Anesthesia Physical Anesthesia Plan  ASA: II  Anesthesia Plan: General   Post-op Pain Management:    Induction: Intravenous  PONV Risk Score and Plan:   Airway Management Planned: LMA  Additional Equipment:   Intra-op Plan:   Post-operative Plan: Extubation in OR  Informed Consent: I have reviewed the patients History and Physical, chart, labs and discussed the procedure including the risks, benefits and alternatives for the proposed anesthesia with the patient or authorized representative who has indicated his/her understanding and acceptance.   Dental Advisory Given  Plan Discussed with: CRNA and Surgeon  Anesthesia Plan Comments: (  )        Anesthesia Quick Evaluation

## 2017-06-16 ENCOUNTER — Ambulatory Visit (HOSPITAL_BASED_OUTPATIENT_CLINIC_OR_DEPARTMENT_OTHER)
Admission: RE | Admit: 2017-06-16 | Discharge: 2017-06-16 | Disposition: A | Discharge status: Home / Self Care | Payer: BC Managed Care – PPO | Source: Ambulatory Visit | Attending: Gynecology | Admitting: Gynecology

## 2017-06-16 ENCOUNTER — Encounter (HOSPITAL_BASED_OUTPATIENT_CLINIC_OR_DEPARTMENT_OTHER): Payer: Self-pay

## 2017-06-16 ENCOUNTER — Encounter (HOSPITAL_BASED_OUTPATIENT_CLINIC_OR_DEPARTMENT_OTHER)
Admission: RE | Disposition: A | Discharge status: Home / Self Care | Payer: Self-pay | Source: Ambulatory Visit | Attending: Gynecology

## 2017-06-16 ENCOUNTER — Ambulatory Visit (HOSPITAL_BASED_OUTPATIENT_CLINIC_OR_DEPARTMENT_OTHER): Payer: BC Managed Care – PPO | Admitting: Anesthesiology

## 2017-06-16 DIAGNOSIS — N840 Polyp of corpus uteri: Secondary | ICD-10-CM

## 2017-06-16 DIAGNOSIS — N920 Excessive and frequent menstruation with regular cycle: Secondary | ICD-10-CM | POA: Insufficient documentation

## 2017-06-16 DIAGNOSIS — N926 Irregular menstruation, unspecified: Secondary | ICD-10-CM | POA: Insufficient documentation

## 2017-06-16 DIAGNOSIS — D251 Intramural leiomyoma of uterus: Secondary | ICD-10-CM

## 2017-06-16 DIAGNOSIS — Z8249 Family history of ischemic heart disease and other diseases of the circulatory system: Secondary | ICD-10-CM | POA: Insufficient documentation

## 2017-06-16 DIAGNOSIS — D252 Subserosal leiomyoma of uterus: Secondary | ICD-10-CM

## 2017-06-16 DIAGNOSIS — D250 Submucous leiomyoma of uterus: Secondary | ICD-10-CM

## 2017-06-16 DIAGNOSIS — D259 Leiomyoma of uterus, unspecified: Secondary | ICD-10-CM | POA: Diagnosis not present

## 2017-06-16 DIAGNOSIS — N923 Ovulation bleeding: Secondary | ICD-10-CM | POA: Insufficient documentation

## 2017-06-16 DIAGNOSIS — Z79899 Other long term (current) drug therapy: Secondary | ICD-10-CM | POA: Insufficient documentation

## 2017-06-16 HISTORY — DX: Polymyalgia rheumatica: M35.3

## 2017-06-16 HISTORY — DX: Adverse effect of unspecified anesthetic, initial encounter: T41.45XA

## 2017-06-16 HISTORY — PX: DILATATION & CURETTAGE/HYSTEROSCOPY WITH MYOSURE: SHX6511

## 2017-06-16 HISTORY — DX: Other complications of anesthesia, initial encounter: T88.59XA

## 2017-06-16 LAB — HCG, SERUM, QUALITATIVE: Preg, Serum: NEGATIVE

## 2017-06-16 SURGERY — DILATATION & CURETTAGE/HYSTEROSCOPY WITH MYOSURE
Anesthesia: General | Site: Vagina

## 2017-06-16 MED ORDER — FENTANYL CITRATE (PF) 100 MCG/2ML IJ SOLN
INTRAMUSCULAR | Status: DC | PRN
  Administered 2017-06-16 (×2): 50 ug via INTRAVENOUS

## 2017-06-16 MED ORDER — LIDOCAINE HCL 1 % IJ SOLN
INTRAMUSCULAR | Status: DC | PRN
  Administered 2017-06-16: 10 mL

## 2017-06-16 MED ORDER — ONDANSETRON HCL 4 MG/2ML IJ SOLN
INTRAMUSCULAR | Status: AC
  Filled 2017-06-16: qty 2

## 2017-06-16 MED ORDER — SODIUM CHLORIDE 0.9 % IR SOLN
Status: DC | PRN
  Administered 2017-06-16: 3000 mL

## 2017-06-16 MED ORDER — FENTANYL CITRATE (PF) 100 MCG/2ML IJ SOLN
25.0000 ug | INTRAMUSCULAR | Status: DC | PRN
  Filled 2017-06-16: qty 1

## 2017-06-16 MED ORDER — DEXAMETHASONE SODIUM PHOSPHATE 4 MG/ML IJ SOLN
INTRAMUSCULAR | Status: DC | PRN
  Administered 2017-06-16: 10 mg via INTRAVENOUS

## 2017-06-16 MED ORDER — CEFOTETAN DISODIUM-DEXTROSE 2-2.08 GM-%(50ML) IV SOLR
INTRAVENOUS | Status: AC
  Filled 2017-06-16: qty 50

## 2017-06-16 MED ORDER — MIDAZOLAM HCL 2 MG/2ML IJ SOLN
INTRAMUSCULAR | Status: AC
  Filled 2017-06-16: qty 2

## 2017-06-16 MED ORDER — DEXTROSE 5 % IV SOLN
INTRAVENOUS | Status: AC
  Filled 2017-06-16: qty 50

## 2017-06-16 MED ORDER — ONDANSETRON HCL 4 MG/2ML IJ SOLN
INTRAMUSCULAR | Status: DC | PRN
  Administered 2017-06-16: 4 mg via INTRAVENOUS

## 2017-06-16 MED ORDER — PROPOFOL 10 MG/ML IV BOLUS
INTRAVENOUS | Status: DC | PRN
  Administered 2017-06-16: 200 mg via INTRAVENOUS

## 2017-06-16 MED ORDER — LIDOCAINE 2% (20 MG/ML) 5 ML SYRINGE
INTRAMUSCULAR | Status: AC
  Filled 2017-06-16: qty 5

## 2017-06-16 MED ORDER — OXYCODONE-ACETAMINOPHEN 5-325 MG PO TABS: 1.0000 | ORAL_TABLET | ORAL | 0 refills | Status: DC | PRN

## 2017-06-16 MED ORDER — DEXAMETHASONE SODIUM PHOSPHATE 10 MG/ML IJ SOLN
INTRAMUSCULAR | Status: AC
  Filled 2017-06-16: qty 1

## 2017-06-16 MED ORDER — KETOROLAC TROMETHAMINE 30 MG/ML IJ SOLN
INTRAMUSCULAR | Status: DC | PRN
  Administered 2017-06-16: 30 mg via INTRAVENOUS

## 2017-06-16 MED ORDER — KETOROLAC TROMETHAMINE 30 MG/ML IJ SOLN
INTRAMUSCULAR | Status: AC
  Filled 2017-06-16: qty 1

## 2017-06-16 MED ORDER — FENTANYL CITRATE (PF) 100 MCG/2ML IJ SOLN
INTRAMUSCULAR | Status: AC
  Filled 2017-06-16: qty 2

## 2017-06-16 MED ORDER — OXYCODONE-ACETAMINOPHEN 5-325 MG PO TABS
ORAL_TABLET | ORAL | Status: AC
  Filled 2017-06-16: qty 1

## 2017-06-16 MED ORDER — LACTATED RINGERS IV SOLN
INTRAVENOUS | Status: DC
  Administered 2017-06-16: 1000 mL via INTRAVENOUS
  Filled 2017-06-16: qty 1000

## 2017-06-16 MED ORDER — ARTIFICIAL TEARS OPHTHALMIC OINT
TOPICAL_OINTMENT | OPHTHALMIC | Status: AC
  Filled 2017-06-16: qty 3.5

## 2017-06-16 MED ORDER — MIDAZOLAM HCL 5 MG/5ML IJ SOLN
INTRAMUSCULAR | Status: DC | PRN
  Administered 2017-06-16: 2 mg via INTRAVENOUS

## 2017-06-16 MED ORDER — PROPOFOL 10 MG/ML IV BOLUS
INTRAVENOUS | Status: AC
  Filled 2017-06-16: qty 40

## 2017-06-16 MED ORDER — DEXTROSE 5 % IV SOLN
2.0000 g | INTRAVENOUS | Status: AC
  Administered 2017-06-16: 2 g via INTRAVENOUS
  Filled 2017-06-16: qty 2

## 2017-06-16 MED ORDER — LIDOCAINE 2% (20 MG/ML) 5 ML SYRINGE
INTRAMUSCULAR | Status: DC | PRN
  Administered 2017-06-16: 80 mg via INTRAVENOUS

## 2017-06-16 MED ORDER — OXYCODONE-ACETAMINOPHEN 5-325 MG PO TABS
1.0000 | ORAL_TABLET | ORAL | Status: DC | PRN
  Administered 2017-06-16: 1 via ORAL
  Filled 2017-06-16: qty 1

## 2017-06-16 SURGICAL SUPPLY — 27 items
CANISTER SUCT 3000ML PPV (MISCELLANEOUS) ×2 IMPLANT
CATH ROBINSON RED A/P 16FR (CATHETERS) ×2 IMPLANT
CLOTH BEACON ORANGE TIMEOUT ST (SAFETY) ×2 IMPLANT
COUNTER NEEDLE 1200 MAGNETIC (NEEDLE) ×2 IMPLANT
DEVICE MYOSURE LITE (MISCELLANEOUS) IMPLANT
DEVICE MYOSURE REACH (MISCELLANEOUS) ×2 IMPLANT
DILATOR CANAL MILEX (MISCELLANEOUS) IMPLANT
FILTER ARTHROSCOPY CONVERTOR (FILTER) ×2 IMPLANT
GLOVE BIO SURGEON STRL SZ7.5 (GLOVE) ×4 IMPLANT
GLOVE BIOGEL PI IND STRL 7.5 (GLOVE) ×3 IMPLANT
GLOVE BIOGEL PI INDICATOR 7.5 (GLOVE) ×3
GOWN STRL REUS W/TWL LRG LVL3 (GOWN DISPOSABLE) ×2 IMPLANT
GOWN STRL REUS W/TWL XL LVL3 (GOWN DISPOSABLE) ×4 IMPLANT
IV NS IRRIG 3000ML ARTHROMATIC (IV SOLUTION) ×2 IMPLANT
KIT RM TURNOVER CYSTO AR (KITS) ×2 IMPLANT
MYOSURE XL FIBROID REM (MISCELLANEOUS)
NDL SAFETY ECLIPSE 18X1.5 (NEEDLE) IMPLANT
NEEDLE HYPO 18GX1.5 SHARP (NEEDLE)
PACK VAGINAL MINOR WOMEN LF (CUSTOM PROCEDURE TRAY) ×2 IMPLANT
PAD OB MATERNITY 4.3X12.25 (PERSONAL CARE ITEMS) ×2 IMPLANT
SEAL ROD LENS SCOPE MYOSURE (ABLATOR) ×2 IMPLANT
SYRINGE LUER LOK 1CC (MISCELLANEOUS) IMPLANT
SYSTEM TISS REMOVAL MYSR XL RM (MISCELLANEOUS) IMPLANT
TOWEL OR 17X24 6PK STRL BLUE (TOWEL DISPOSABLE) ×4 IMPLANT
TUBING AQUILEX INFLOW (TUBING) ×2 IMPLANT
TUBING AQUILEX OUTFLOW (TUBING) ×2 IMPLANT
WATER STERILE IRR 500ML POUR (IV SOLUTION) IMPLANT

## 2017-06-16 NOTE — Discharge Instructions (Signed)
°  NO ADVIL , ALEVE, MOTRIN, IBUPROFEN UNTIL 2:45 PM TODAY  Postoperative Instructions Hysteroscopy D & C  Dr. Phineas Real and the nursing staff have discussed postoperative instructions with you.  If you have any questions please ask them before you leave the hospital, or call Dr Elisabeth Most office at [phone].    We would like to emphasize the following instructions:   ? Call the office to make your follow-up appointment as recommended by Dr Phineas Real (usually 1-2 weeks).  ? You were given a prescription, or one was ordered for you at the pharmacy you designated.  Get that prescription filled and take the medication according to instructions.  ? You may eat a regular diet, but slowly until you start having bowel movements.  ? Drink plenty of water daily.  ? Nothing in the vagina (intercourse, douching, objects of any kind) for two weeks.  When reinitiating intercourse, if it is uncomfortable, stop and make an appointment with Dr Phineas Real to be evaluated.  ? No driving for one to two days until the effects of anesthesia has worn off.  No traveling out of town for several days.  ? You may shower, but no baths for one week.  Walking up and down stairs is ok.  No heavy lifting, prolonged standing, repeated bending or any working out until your post op check.  ? Rest frequently, listen to your body and do not push yourself and overdo it.  ? Call if:  o Your pain medication does not seem strong enough. o Worsening pain or abdominal bloating o Persistent nausea or vomiting o Difficulty with urination or bowel movements. o Temperature of 101 degrees or higher. o Heavy vaginal bleeding.  If your period is due, you may use tampons. o You have any questions or concerns    Post Anesthesia Home Care Instructions  Activity: Get plenty of rest for the remainder of the day. A responsible individual must stay with you for 24 hours following the procedure.  For the next 24 hours, DO  NOT: -Drive a car -Paediatric nurse -Drink alcoholic beverages -Take any medication unless instructed by your physician -Make any legal decisions or sign important papers.  Meals: Start with liquid foods such as gelatin or soup. Progress to regular foods as tolerated. Avoid greasy, spicy, heavy foods. If nausea and/or vomiting occur, drink only clear liquids until the nausea and/or vomiting subsides. Call your physician if vomiting continues.  Special Instructions/Symptoms: Your throat may feel dry or sore from the anesthesia or the breathing tube placed in your throat during surgery. If this causes discomfort, gargle with warm salt water. The discomfort should disappear within 24 hours.  If you had a scopolamine patch placed behind your ear for the management of post- operative nausea and/or vomiting:  1. The medication in the patch is effective for 72 hours, after which it should be removed.  Wrap patch in a tissue and discard in the trash. Wash hands thoroughly with soap and water. 2. You may remove the patch earlier than 72 hours if you experience unpleasant side effects which may include dry mouth, dizziness or visual disturbances. 3. Avoid touching the patch. Wash your hands with soap and water after contact with the patch.

## 2017-06-16 NOTE — Anesthesia Postprocedure Evaluation (Signed)
Anesthesia Post Note  Patient: Lori Richmond  Procedure(s) Performed: DILATATION & CURETTAGE/HYSTEROSCOPY WITH MYOSURE (N/A Vagina )     Patient location during evaluation: PACU Anesthesia Type: General Level of consciousness: awake and alert Pain management: pain level controlled Vital Signs Assessment: post-procedure vital signs reviewed and stable Respiratory status: spontaneous breathing, nonlabored ventilation, respiratory function stable and patient connected to nasal cannula oxygen Cardiovascular status: blood pressure returned to baseline and stable Postop Assessment: no apparent nausea or vomiting Anesthetic complications: no    Last Vitals:  Vitals:   06/16/17 0830 06/16/17 0927  BP: 130/90 (!) 142/84  Pulse: 93 85  Resp: 16 16  Temp:  36.8 C  SpO2: 99% 100%    Last Pain:  Vitals:   06/16/17 0927  TempSrc: Oral  PainSc:                  Riccardo Dubin

## 2017-06-16 NOTE — Transfer of Care (Signed)
   Last Vitals:  Vitals:   06/16/17 0534 06/16/17 0804  BP: 122/61 (!) (P) 131/105  Pulse: 93   Resp: 14 (P) 12  Temp: 36.8 C (P) 36.9 C  SpO2: 100%     Last Pain:  Vitals:   06/16/17 0804  TempSrc:   PainSc: (P) 7       Patients Stated Pain Goal: 8 (06/16/17 5379)  Immediate Anesthesia Transfer of Care Note  Patient: Lori Richmond  Procedure(s) Performed: Procedure(s) (LRB): DILATATION & CURETTAGE/HYSTEROSCOPY WITH MYOSURE (N/A)  Patient Location: PACU  Anesthesia Type: General  Level of Consciousness: awake, alert  and oriented  Airway & Oxygen Therapy: Patient Spontanous Breathing and Patient connected to nasal cannula oxygen  Post-op Assessment: Report given to PACU RN and Post -op Vital signs reviewed and stable  Post vital signs: Reviewed and stable  Complications: No apparent anesthesia complications

## 2017-06-16 NOTE — Anesthesia Procedure Notes (Signed)
Procedure Name: LMA Insertion Date/Time: 06/16/2017 7:30 AM Performed by: Lyndle Herrlich, MD Pre-anesthesia Checklist: Patient identified, Emergency Drugs available, Suction available and Patient being monitored Patient Re-evaluated:Patient Re-evaluated prior to induction Oxygen Delivery Method: Circle system utilized Preoxygenation: Pre-oxygenation with 100% oxygen Induction Type: IV induction Ventilation: Mask ventilation without difficulty LMA: LMA inserted LMA Size: 4.0 Number of attempts: 1 Airway Equipment and Method: Bite block Placement Confirmation: positive ETCO2 Tube secured with: Tape Dental Injury: Teeth and Oropharynx as per pre-operative assessment

## 2017-06-16 NOTE — H&P (Signed)
The patient was examined.  I reviewed the proposed surgery and consent form with the patient.  The dictated history and physical is current and accurate and all questions were answered. The patient is ready to proceed with surgery and has a realistic understanding and expectation for the outcome.   Anastasio Auerbach MD, 7:20 AM 06/16/2017

## 2017-06-16 NOTE — Op Note (Signed)
Lori Richmond 1970/12/25 [id number]   Post Operative Note   Date of surgery:  06/16/2017  Pre Op Dx: Menorrhagia with irregular bleeding, leiomyoma, endometrial polyp  Post Op Dx: Menorrhagia, irregular bleeding, leiomyoma, endometrial polyp  Procedure: Hysteroscopy, D&C, Myosure resection submucous myoma and endometrial polyp  Surgeon:  Belinda Block Iretha Kirley  Anesthesia:  General  EBL: 10 cc  Distended media discrepancy: 086 cc saline  Complications:  None  Specimen: #1 endometrial resection contents #2 endometrial curetting to pathology  Findings: EUA: External BUS vagina normal.  Cervix normal.  Uterus bulky and irregular consistent with leiomyoma approximately 12 weeks size.  Adnexa without gross masses   Hysteroscopy: Adequate noting fundus, right/left tubal ostia, anterior/posterior endometrial surfaces, lower uterine segment and endocervical canal all visualized.  2 submucous myomas left posterior cornual region resected in their entirety.  Small endometrial polyp left anterior cornual region resected to the level of the surrounding endometrium.  Procedure:  The patient was taken to the operating room, was placed in the low dorsal lithotomy position, underwent general anesthesia, received a perineal/vaginal preparation with Betadine solution per nursing personnel and the bladder was emptied with in and out Foley catheterization. The timeout was performed by the surgical team. An EUA was performed. The patient was draped in the usual fashion. The cervix was visualized with a speculum, anterior lip grasped with a single-tooth tenaculum and a paracervical block was placed using 10 cc's of 1% lidocaine. The cervix was gently dilated to admit the Myosure hysteroscope and hysteroscopy was performed with findings noted above. Using the Myosure Reach resectoscopic wand the submucous myomas and the endometrial polyp were resected in their entirety to the level the surrounding endometrium. A  gentle sharp curettage was performed. Both specimens were sent separately to pathology.  Repeat hysteroscopy showed an empty cavity with good distention and no evidence of perforation. The instruments were removed and adequate hemostasis was visualized at the tenaculum site and external cervical os.  The pathology specimens were identified to nursing personnel and the instrument, sponge and needle count were verified correct.  The patient was given intraoperative Toradol, was awakened without difficulty and was taken to the recovery room in good condition having tolerated the procedure well.    Anastasio Auerbach MD, 8:04 AM 06/16/2017

## 2017-06-17 ENCOUNTER — Encounter (HOSPITAL_BASED_OUTPATIENT_CLINIC_OR_DEPARTMENT_OTHER): Payer: Self-pay | Admitting: Gynecology

## 2017-07-02 ENCOUNTER — Ambulatory Visit (INDEPENDENT_AMBULATORY_CARE_PROVIDER_SITE_OTHER): Payer: BC Managed Care – PPO | Admitting: Gynecology

## 2017-07-02 ENCOUNTER — Encounter: Payer: Self-pay | Admitting: Gynecology

## 2017-07-02 VITALS — BP 118/74

## 2017-07-02 DIAGNOSIS — Z9889 Other specified postprocedural states: Secondary | ICD-10-CM

## 2017-07-02 NOTE — Progress Notes (Signed)
    Lori Richmond 1971-06-16 [id number]        47 y.o.  G1P1 for postoperative visit status post hysteroscopic resection submucous myoma and endometrial polyps.  Has done well since with no significant bleeding or pain.  Past medical history,surgical history, problem list, medications, allergies, family history and social history were all reviewed and documented in the EPIC chart.  Directed ROS with pertinent positives and negatives documented in the history of present illness/assessment and plan.  Exam: Lori Richmond assistant Vitals:   07/02/17 1225  BP: 118/74   General appearance:  Normal Abdomen soft nontender without masses guarding rebound Pelvic external BUS vagina normal.  Cervix normal.  Uterus enlarged consistent with leiomyoma.  Adnexa without masses or tenderness  Assessment/Plan:  47 y.o. G1P1 with normal postoperative visit status post hysteroscopic resection endometrial polyps and submucous myomas.  I reviewed the benign pathology and pictures from the surgery with her.  She currently is on oral contraceptives but wants to try the Mirena IUD.  I reviewed the insertional process with her to include the risks of perforation/migration requiring surgery to remove, infection, hormonal absorption with systemic effects and failure with pregnancy.  She understands and accepts these risks.  She is going to schedule an appointment for placement.  Otherwise she will follow-up when due for annual exam.    Lori Auerbach MD, 12:54 PM 07/02/2017

## 2017-07-02 NOTE — Patient Instructions (Signed)
Follow-up for the IUD as arranged.

## 2017-07-09 ENCOUNTER — Telehealth: Payer: Self-pay | Admitting: *Deleted

## 2017-07-09 MED ORDER — NORGESTIM-ETH ESTRAD TRIPHASIC 0.18/0.215/0.25 MG-35 MCG PO TABS
1.0000 | ORAL_TABLET | Freq: Every day | ORAL | 0 refills | Status: DC
Start: 2017-07-09 — End: 2017-08-27

## 2017-07-09 MED ORDER — MEGESTROL ACETATE 20 MG PO TABS: 20.0000 mg | ORAL_TABLET | Freq: Two times a day (BID) | ORAL | 0 refills | Status: DC

## 2017-07-09 NOTE — Telephone Encounter (Signed)
Okay to refill birth control pills times 3 months.  Megace 20 mg twice daily until bleeding gone #20

## 2017-07-09 NOTE — Telephone Encounter (Signed)
Pt post hysteroscopic resection submucous myoma and endometrial polyps on 06/16/17. Has been doing well, states spotting started on 07/04/17 up until Tuesday. States yesterday and today the bleeding has increased changing pads every 1 1/2 hour at times, with clots. She asked if Rx to help control bleeding can be sent to pharmacy, has had megace in past. Also needs new Rx for ortho try cycle 28 day tabs, will use this until mirena insertion. Please advise

## 2017-07-09 NOTE — Telephone Encounter (Signed)
Pt aware, Rx sent. 

## 2017-07-30 ENCOUNTER — Ambulatory Visit: Payer: BC Managed Care – PPO | Admitting: Gynecology

## 2017-07-30 ENCOUNTER — Encounter: Payer: Self-pay | Admitting: Gynecology

## 2017-07-30 ENCOUNTER — Ambulatory Visit (INDEPENDENT_AMBULATORY_CARE_PROVIDER_SITE_OTHER): Payer: BC Managed Care – PPO | Admitting: Gynecology

## 2017-07-30 VITALS — BP 116/74

## 2017-07-30 DIAGNOSIS — Z30430 Encounter for insertion of intrauterine contraceptive device: Secondary | ICD-10-CM | POA: Diagnosis not present

## 2017-07-30 HISTORY — PX: INTRAUTERINE DEVICE INSERTION: SHX323

## 2017-07-30 NOTE — Patient Instructions (Signed)

## 2017-07-30 NOTE — Progress Notes (Signed)
    Kaylon Iseman 1971/06/09 [id number]        47 y.o.  G1P1  presents for Mirena IUD placement. She has read through the booklet, has no contraindications and signed the consent form.  She currently is on oral contraceptives and just starting her period.  Myoma with menorrhagia recently underwent hysteroscopic resection of submucous myomas and endometrial polyps. I reviewed the insertional process with her as well as the risks to include infection, either immediate or long-term, uterine perforation or migration requiring surgery to remove, other complications such as pain, hormonal side effects and possibility of failure with subsequent pregnancy.   Exam with [name] assistant Vitals:   07/30/17 1120  BP: 116/74    Pelvic: External BUS vagina normal. Cervix normal with light blood. Uterus bulky midline mobile nontender. Adnexa without gross masses or tenderness.  Procedure: The cervix was cleansed with Betadine, anterior lip grasped with a single-tooth tenaculum, the uterus was sounded and a Mirena IUD was placed according to manufacturer's recommendations without difficulty. The strings were trimmed. The patient tolerated well and will follow up in one month for a postinsertional check.  Lot number:  [id number]    Anastasio Auerbach MD, 11:49 AM 07/30/2017

## 2017-08-07 ENCOUNTER — Telehealth: Payer: Self-pay | Admitting: *Deleted

## 2017-08-07 MED ORDER — MEGESTROL ACETATE 20 MG PO TABS: ORAL_TABLET | ORAL | 0 refills | Status: DC

## 2017-08-07 NOTE — Telephone Encounter (Signed)
Pt has Mirena IUD insertion on 07/30/17 and still bleeding from cycle, pt said bleeding was heavy, but not now, she asked if megace can be sent due to bleeding? Please advise

## 2017-08-07 NOTE — Telephone Encounter (Signed)
Okay for Megace 20 mg daily times 10 days.  #30 to have backup just in case she needs additional in the future

## 2017-08-07 NOTE — Telephone Encounter (Signed)
Pt informed with the below note, Rx sent. 

## 2017-08-27 ENCOUNTER — Encounter: Payer: Self-pay | Admitting: Gynecology

## 2017-08-27 ENCOUNTER — Ambulatory Visit: Payer: BC Managed Care – PPO | Admitting: Gynecology

## 2017-08-27 VITALS — BP 118/74

## 2017-08-27 DIAGNOSIS — Z30431 Encounter for routine checking of intrauterine contraceptive device: Secondary | ICD-10-CM | POA: Diagnosis not present

## 2017-08-27 NOTE — Patient Instructions (Signed)
Follow-up middle to end of this year for annual exam.  Sooner if any issues.

## 2017-08-27 NOTE — Progress Notes (Signed)
    Lori Richmond [birth year] [id number]        47 y.o.  G1P1 presents for follow-up IUD check.  Had Mirena IUD placed end of January.  Notes that her bleeding has significantly improved.  She does have some spotting on and off but much better than before.  History of leiomyoma.    Past medical history,surgical history, problem list, medications, allergies, family history and social history were all reviewed and documented in the EPIC chart.  Directed ROS with pertinent positives and negatives documented in the history of present illness/assessment and plan.  Exam: [name] assistant Vitals:   08/27/17 1130  BP: 118/74   General appearance:  Normal Soft nontender without masses guarding rebound Pelvic external BUS vagina normal.  Cervix normal with IUD string visualized and appropriate length.  Uterus bulky midline mobile consistent with leiomyoma.  Adnexa without gross masses or tenderness  Assessment/Plan:  47 y.o. G1P1 with normal IUD follow-up exam.  Overall patient satisfied with her decreased bleeding and it has only been a month.  Recommended she monitor over the next several months and hopefully her bleeding will get less.  She will follow-up middle to end of this year for annual exam.  Sooner if any issues.    Anastasio Auerbach MD, 11:41 AM 08/27/2017

## 2017-09-07 ENCOUNTER — Telehealth: Payer: Self-pay | Admitting: *Deleted

## 2017-09-07 MED ORDER — MEGESTROL ACETATE 20 MG PO TABS: ORAL_TABLET | ORAL | 1 refills | Status: DC

## 2017-09-07 NOTE — Telephone Encounter (Signed)
Okay for Megace 20 mg #30 with 1 refill 1 p.o. twice daily for bleeding as needed

## 2017-09-07 NOTE — Telephone Encounter (Signed)
Pt has Mirena IUD check up on 08/27/17, less bleeding the time, but the last couple of works spotting returned, x 2 days flow bleeding now, medium she asked if megace can be refilled with refills to have on hand? Please advise

## 2017-09-07 NOTE — Telephone Encounter (Signed)
Left on voicemail Rx sent 

## 2017-10-28 ENCOUNTER — Telehealth: Payer: Self-pay | Admitting: *Deleted

## 2017-10-28 NOTE — Telephone Encounter (Signed)
Patient called requesting refill on megace c/o light flow of bleeding with Mirena IUD, I called pt back and left on voicemail in march we send Rx with additional refill on megace can use this once if not used already. Pt doesn't remember will check with pharmacy

## 2017-11-17 ENCOUNTER — Telehealth: Payer: Self-pay | Admitting: *Deleted

## 2017-11-17 MED ORDER — MEGESTROL ACETATE 20 MG PO TABS: ORAL_TABLET | ORAL | 0 refills | Status: DC

## 2017-11-17 NOTE — Telephone Encounter (Signed)
Patient called had IUD inserted on 07/30/17 has been using megace 20 mg tablet monthly since insertion [place] and asked if she could have a 3 month supply [place]. Patient said she uses the megace at least 2-3 weeks out of the month. Please advise

## 2017-11-17 NOTE — Telephone Encounter (Signed)
Okay for prescription for Megace.  Megace 20 mg #90

## 2017-11-17 NOTE — Telephone Encounter (Signed)
Patient aware, Rx sent.  

## 2018-02-28 DIAGNOSIS — R87618 Other abnormal cytological findings on specimens from cervix uteri: Secondary | ICD-10-CM

## 2018-02-28 HISTORY — DX: Other abnormal cytological findings on specimens from cervix uteri: R87.618

## 2018-03-25 ENCOUNTER — Encounter: Payer: Self-pay | Admitting: Gynecology

## 2018-03-25 ENCOUNTER — Ambulatory Visit: Payer: BC Managed Care – PPO | Admitting: Gynecology

## 2018-03-25 VITALS — BP 112/70 | Ht 63.0 in | Wt 141.0 lb

## 2018-03-25 DIAGNOSIS — Z1151 Encounter for screening for human papillomavirus (HPV): Secondary | ICD-10-CM

## 2018-03-25 DIAGNOSIS — Z30431 Encounter for routine checking of intrauterine contraceptive device: Secondary | ICD-10-CM | POA: Diagnosis not present

## 2018-03-25 DIAGNOSIS — Z01419 Encounter for gynecological examination (general) (routine) without abnormal findings: Secondary | ICD-10-CM | POA: Diagnosis not present

## 2018-03-25 DIAGNOSIS — D259 Leiomyoma of uterus, unspecified: Secondary | ICD-10-CM

## 2018-03-25 MED ORDER — MEGESTROL ACETATE 20 MG PO TABS: ORAL_TABLET | ORAL | 0 refills | Status: DC

## 2018-03-25 NOTE — Addendum Note (Signed)
Addended by: Nelva Nay on: 03/25/2018 12:41 PM   Modules accepted: Orders

## 2018-03-25 NOTE — Patient Instructions (Addendum)
Follow up for ultrasound as scheduled   Call to Schedule your mammogram  Facilities in [place]: 1)  [facility] [address] [place] Phone: [phone] 2)  Dr. Isaiah Blakes at [facility] [address] Phone: [phone]     Mammogram A mammogram is an X-ray test to find changes in a woman's breast. You should get a mammogram if:  You are 46 years of age or older  You have risk factors.   Your doctor recommends that you have one.  BEFORE THE TEST  Do not schedule the test the week before your period, especially if your breasts are sore during this time.  On the day of your mammogram:  Wash your breasts and armpits well. After washing, do not put on any deodorant or talcum powder on until after your test.   Eat and drink as you usually do.   Take your medicines as usual.   If you are diabetic and take insulin, make sure you:   Eat before coming for your test.   Take your insulin as usual.   If you cannot keep your appointment, call before the appointment to cancel. Schedule another appointment.  TEST  You will need to undress from the waist up. You will put on a hospital gown.   Your breast will be put on the mammogram machine, and it will press firmly on your breast with a piece of plastic called a compression paddle. This will make your breast flatter so that the machine can X-ray all parts of your breast.   Both breasts will be X-rayed. Each breast will be X-rayed from above and from the side. An X-ray might need to be taken again if the picture is not good enough.   The mammogram will last about 15 to 30 minutes.  AFTER THE TEST Finding out the results of your test Ask when your test results will be ready. Make sure you get your test results.  Document Released: 09/12/2008 Document Revised: 06/05/2011 Document Reviewed: 09/12/2008 [facility] Patient Information 2012 [place].

## 2018-03-25 NOTE — Progress Notes (Signed)
    Lori Richmond 1971-01-05 [id number]        47 y.o.  G1P1 for annual gynecologic exam.  With history of leiomyoma and irregular bleeding.  Had endometrial polyps excised earlier this year and subsequently had a Mirena IUD placed.  Notes minimal bleeding with spotting intermittently throughout the month.  Does use Megace 20 mg daily intermittently if spotting is prolonged.  Past medical history,surgical history, problem list, medications, allergies, family history and social history were all reviewed and documented as reviewed in the EPIC chart.  ROS:  Performed with pertinent positives and negatives included in the history, assessment and plan.   Additional significant findings : None   Exam: [name] assistant Vitals:   03/25/18 1151  BP: 112/70  Weight: 141 lb (64 kg)  Height: '5\' 3"'$  (1.6 m)   Body mass index is 24.98 kg/m.  General appearance:  Normal affect, orientation and appearance. Skin: Grossly normal HEENT: Without gross lesions.  No cervical or supraclavicular adenopathy. Thyroid normal.  Lungs:  Clear without wheezing, rales or rhonchi Cardiac: RR, without RMG Abdominal:  Soft, nontender, without masses, guarding, rebound, organomegaly or hernia Breasts:  Examined lying and sitting without masses, retractions, discharge or axillary adenopathy.  Bilateral implants noted Pelvic:  Ext, BUS, Vagina: Normal  Cervix: Normal.  Pap smear/HPV IUD string visualized  Uterus: 12 weeks size irregular consistent with leiomyoma  Adnexa: Without masses or tenderness    Anus and perineum: Normal   Rectovaginal: Normal sphincter tone without palpated masses or tenderness.    Assessment/Plan:  47 y.o. G1P1 female for annual gynecologic exam.   1. Irregular spotting with Mirena IUD placed 06/2017.  Leiomyoma approximately 12 weeks size.  Status post hysteroscopic resection endometrial polyps and submucous myoma.  Notes her bleeding is much better and she is not bleeding heavily but  does have if some nuisance bleeding throughout the month.  Uses Megace intermittently to control.  Recommended baseline ultrasound now for leiomyoma check.  Discussed alternatives to include hysterectomy and the benefits of hysterectomy from no bleeding standpoint.  Ability to keep both ovaries for ongoing hormonal production also discussed.  Quality of life issues reviewed.  Will follow-up for ultrasound and then go from there. 2. Mammography 2017.  Recommended baseline mammogram now.  Patient agrees to schedule.  Names and numbers provided.  Breast exam normal today.   3. Pap smear 2017.  Pap smear/HPV today.  No history of abnormal Pap smears previously. 4. Health maintenance.  No routine lab work done as patient reports this done elsewhere.  Follow-up for ultrasound as scheduled   Anastasio Auerbach MD, 12:29 PM 03/25/2018

## 2018-04-01 ENCOUNTER — Encounter: Payer: Self-pay | Admitting: Gynecology

## 2018-04-01 LAB — PAP IG AND HPV HIGH-RISK: HPV DNA HIGH RISK: DETECTED — AB

## 2018-04-01 LAB — HPV TYPE 16 AND 18/45 RNA
HPV TYPE 16 RNA: NOT DETECTED
HPV TYPE 18/45 RNA: NOT DETECTED

## 2018-04-19 ENCOUNTER — Ambulatory Visit: Payer: BC Managed Care – PPO | Admitting: Gynecology

## 2018-04-19 ENCOUNTER — Other Ambulatory Visit: Payer: Self-pay | Admitting: Gynecology

## 2018-04-19 ENCOUNTER — Encounter: Payer: Self-pay | Admitting: Gynecology

## 2018-04-19 ENCOUNTER — Ambulatory Visit (INDEPENDENT_AMBULATORY_CARE_PROVIDER_SITE_OTHER): Payer: BC Managed Care – PPO

## 2018-04-19 VITALS — BP 114/70

## 2018-04-19 DIAGNOSIS — D252 Subserosal leiomyoma of uterus: Secondary | ICD-10-CM

## 2018-04-19 DIAGNOSIS — D259 Leiomyoma of uterus, unspecified: Secondary | ICD-10-CM

## 2018-04-19 DIAGNOSIS — D251 Intramural leiomyoma of uterus: Secondary | ICD-10-CM

## 2018-04-19 DIAGNOSIS — Z30431 Encounter for routine checking of intrauterine contraceptive device: Secondary | ICD-10-CM

## 2018-04-19 NOTE — Patient Instructions (Signed)
Follow-up when due for annual exam next year

## 2018-04-19 NOTE — Progress Notes (Addendum)
    Sun Loss [birth year] [id number]        47 y.o.  G1P1 presents for ultrasound.  History of leiomyoma, irregular bleeding/heavy bleeding and IUD.  Also history of endometrial polyps and submucous myoma resection.  Patient's bleeding is much better but does have occasional intermenstrual spotting.  Presents now for a baseline ultrasound  Past medical history,surgical history, problem list, medications, allergies, family history and social history were all reviewed and documented in the EPIC chart.  Directed ROS with pertinent positives and negatives documented in the history of present illness/assessment and plan.  Exam: Vitals:   04/19/18 0935  BP: 114/70   General appearance:  Normal  Ultrasound transvaginal and transabdominal shows uterus overall enlarged with multiple myomas noted measuring 56 mm, 55 mm, 36 mm, 35 mm, 34 mm, 26 mm, 25 mm, 22 mm, 21 mm, 19 mm.  Endometrial echo 3.7 mm.  IUD identified within the endometrial cavity.  Right and left ovaries visualized and normal.  Cul-de-sac negative  Assessment/Plan:  47 y.o. G1P1 with multiple myomas as noted above.  Right and left ovaries visualized and normal.  Endometrial echo thin at 3.7 mm.  Patient will continue to monitor as long she continues well with her IUD then will follow-up in 1 year for annual exam, sooner as needed.    Anastasio Auerbach MD, 11:13 AM 04/19/2018

## 2018-05-06 ENCOUNTER — Encounter: Payer: Self-pay | Admitting: Gynecology

## 2018-05-06 ENCOUNTER — Ambulatory Visit: Payer: BC Managed Care – PPO | Admitting: Gynecology

## 2018-05-06 ENCOUNTER — Telehealth: Payer: Self-pay | Admitting: *Deleted

## 2018-05-06 VITALS — BP 118/76

## 2018-05-06 DIAGNOSIS — N644 Mastodynia: Secondary | ICD-10-CM

## 2018-05-06 NOTE — Telephone Encounter (Signed)
-----   Message from Anastasio Auerbach, MD sent at 05/06/2018  8:22 AM EST ----- Schedule diagnostic mammogram and left breast ultrasound at the breast center reference new onset left breast pain tail of Spence region.  Physician exam is negative

## 2018-05-06 NOTE — Telephone Encounter (Signed)
Patient scheduled on 05/11/18 @ 8:50am patient informed.

## 2018-05-06 NOTE — Progress Notes (Signed)
    Lori Richmond Oct 31, 1970 [id number]        47 y.o.  G1P1 presents complaining of new onset left breast pain starting beginning 5 days ago.  She notes discomfort that increases throughout the day.  Comes and goes as an aching feeling.  Palpable abnormalities or nipple discharge on self breast exam.  Last mammogram 2017.  Recent breast exam at her annual exam September 2019 was normal.  Has bilateral implants.  Past medical history,surgical history, problem list, medications, allergies, family history and social history were all reviewed and documented in the EPIC chart.  Directed ROS with pertinent positives and negatives documented in the history of present illness/assessment and plan.  Exam: [name] assistant Vitals:   05/06/18 0808  BP: 118/76   General appearance:  Normal HEENT normal with no palpable cervical or supraclavicular adenopathy. Both breasts examined lying and sitting without masses, retractions, discharge, adenopathy  Assessment/Plan:  47 y.o. G1P1 new onset left breast pain in the tail of Spence region.  No palpable abnormalities.  Recommended diagnostic mammography and ultrasound.  Will make arrangements for this and patient knows to expect a phone call to schedule.  We discussed the various possibilities to include benign mastalgia versus breast malignancy.  If the mammogram and ultrasound are normal then we will plan expectant management for now.  If persists she will represent for reevaluation.    Anastasio Auerbach MD, 8:23 AM 05/06/2018

## 2018-05-06 NOTE — Patient Instructions (Signed)
Follow up for mammogram as scheduled

## 2018-05-11 ENCOUNTER — Other Ambulatory Visit: Payer: Self-pay | Admitting: Gynecology

## 2018-05-11 ENCOUNTER — Ambulatory Visit
Admission: RE | Admit: 2018-05-11 | Discharge: 2018-05-11 | Disposition: A | Discharge status: Home / Self Care | Payer: BC Managed Care – PPO | Source: Ambulatory Visit | Attending: Gynecology | Admitting: Gynecology

## 2018-05-11 DIAGNOSIS — N644 Mastodynia: Secondary | ICD-10-CM

## 2018-08-17 ENCOUNTER — Ambulatory Visit
Admission: RE | Admit: 2018-08-17 | Discharge: 2018-08-17 | Disposition: A | Discharge status: Home / Self Care | Payer: BC Managed Care – PPO | Source: Ambulatory Visit | Attending: Gynecology | Admitting: Gynecology

## 2018-08-17 ENCOUNTER — Other Ambulatory Visit: Payer: Self-pay | Admitting: Gynecology

## 2018-08-17 DIAGNOSIS — N644 Mastodynia: Secondary | ICD-10-CM

## 2018-09-14 ENCOUNTER — Telehealth: Payer: Self-pay | Admitting: *Deleted

## 2018-09-14 NOTE — Telephone Encounter (Signed)
Pt left message in VM requesting refill on Megace.  States that bleeding is better but still happening. Need more information on bleeding before asking Dr Phineas Real for refill. KW CMA

## 2018-09-15 NOTE — Telephone Encounter (Signed)
Pt called back with more information on her bleeding as to why she is taking and requesting refill on Megace.  Pt states bleeding is definitely occasional and not that heavy but when it comes it lasts for over a week, therefore she takes it once a day to get it to stop and it does . Pt does have IUD and myomas. Pt requesting refill on Megace. PLease advise Lori Richmond CMA

## 2018-09-16 MED ORDER — MEGESTROL ACETATE 20 MG PO TABS: ORAL_TABLET | ORAL | 1 refills | Status: DC

## 2018-09-16 NOTE — Telephone Encounter (Signed)
Pt informed. Pt prescription sent to pharmacy. KW CMA

## 2018-09-16 NOTE — Telephone Encounter (Signed)
Okay for Megace 20 mg #30 with 1 refill

## 2019-03-18 ENCOUNTER — Telehealth: Payer: Self-pay | Admitting: *Deleted

## 2019-03-18 NOTE — Telephone Encounter (Signed)
Pt would like a refill on Megace. Will route to Dr. Phineas Real for Approval or denial   Pt complains of occasional bleeding with no there symptoms . Annual visit  is scheduled for 04/01/2019

## 2019-03-20 NOTE — Telephone Encounter (Signed)
Okay for Megace 20 mg #31 p.o. daily as needed for bleeding refill x1

## 2019-03-21 ENCOUNTER — Telehealth: Payer: Self-pay

## 2019-03-21 MED ORDER — MEGESTROL ACETATE 20 MG PO TABS: ORAL_TABLET | ORAL | 1 refills | Status: DC

## 2019-03-21 NOTE — Telephone Encounter (Signed)
Refills sent per Dr. Loetta Rough. KAB notified patient.

## 2019-03-21 NOTE — Telephone Encounter (Signed)
Pt aware and will pick up medication.medciation sent to pharmacy

## 2019-03-22 ENCOUNTER — Encounter: Payer: Self-pay | Admitting: Gynecology

## 2019-03-31 ENCOUNTER — Encounter: Payer: BC Managed Care – PPO | Admitting: Gynecology

## 2019-03-31 ENCOUNTER — Other Ambulatory Visit: Payer: Self-pay

## 2019-04-01 ENCOUNTER — Encounter: Payer: Self-pay | Admitting: Gynecology

## 2019-04-01 ENCOUNTER — Ambulatory Visit (INDEPENDENT_AMBULATORY_CARE_PROVIDER_SITE_OTHER): Payer: BC Managed Care – PPO | Admitting: Gynecology

## 2019-04-01 VITALS — BP 120/74 | Ht 63.0 in | Wt 142.0 lb

## 2019-04-01 DIAGNOSIS — Z23 Encounter for immunization: Secondary | ICD-10-CM

## 2019-04-01 DIAGNOSIS — Z13220 Encounter for screening for lipoid disorders: Secondary | ICD-10-CM | POA: Diagnosis not present

## 2019-04-01 DIAGNOSIS — Z01419 Encounter for gynecological examination (general) (routine) without abnormal findings: Secondary | ICD-10-CM | POA: Diagnosis not present

## 2019-04-01 DIAGNOSIS — R87810 Cervical high risk human papillomavirus (HPV) DNA test positive: Secondary | ICD-10-CM

## 2019-04-01 DIAGNOSIS — Z30431 Encounter for routine checking of intrauterine contraceptive device: Secondary | ICD-10-CM

## 2019-04-01 MED ORDER — MEGESTROL ACETATE 20 MG PO TABS: ORAL_TABLET | ORAL | 2 refills | Status: AC

## 2019-04-01 NOTE — Progress Notes (Addendum)
    Krystyna Peifer [birth year] [id number]        48 y.o.  G1P1 for annual gynecologic exam.  Several issues noted below.  Past medical history,surgical history, problem list, medications, allergies, family history and social history were all reviewed and documented as reviewed in the EPIC chart.  ROS:  Performed with pertinent positives and negatives included in the history, assessment and plan.   Additional significant findings : None   Exam: [name] assistant Vitals:   04/01/19 1005  BP: 120/74  Weight: 142 lb (64.4 kg)  Height: '5\' 3"'$  (1.6 m)   Body mass index is 25.15 kg/m.  General appearance:  Normal affect, orientation and appearance. Skin: Grossly normal HEENT: Without gross lesions.  No cervical or supraclavicular adenopathy. Thyroid normal.  Lungs:  Clear without wheezing, rales or rhonchi Cardiac: RR, without RMG Abdominal:  Soft, nontender, without masses, guarding, rebound, organomegaly or hernia Breasts:  Examined lying and sitting without masses, retractions, discharge or axillary adenopathy.  Bilateral implants noted Pelvic:  Ext, BUS, Vagina: Normal  Cervix: Normal with slight staining.  IUD string visualized.  Pap smear/HPV done  Uterus: 12 weeks size midline mobile irregular consistent with history of leiomyoma  Adnexa: Without masses or tenderness    Anus and perineum: Normal   Rectovaginal: Normal sphincter tone without palpated masses or tenderness.    Assessment/Plan:  48 y.o. G1P1 female for annual gynecologic exam.   1. Leiomyoma.  Patient has a history of enlarged uterus with multiple myomas.  Had irregular heavy bleeding previously.  Status post hysteroscopic resection endometrial polyps and submucous myomas.  Bleeding lightened afterwards.  Had Mirena IUD placed 06/2017.  Now has some bleeding on and off that she will take Megace 20 mg daily when it becomes heavy.  We have discussed multiple times treatment options to include surgery and at this  point the patient is not interested in surgery.  Megace 20 mg #30 with 2 refills provided.  Will follow-up if she wants to pursue other options. 2. Positive HPV.  Pap smear last year showed normal cytology with positive high high risk HPV screen.  Negative subtypes 16, 18/45.  Pap smear with HPV screen done today.  We will follow-up for results. 3. Mammography coming due in November.  Breast exam normal today. 4. Health maintenance.  Patient requests baseline labs.  CBC, CMP and lipid profile ordered.  Follow-up 1 year, sooner as needed   Anastasio Auerbach MD, 10:24 AM 04/01/2019

## 2019-04-01 NOTE — Addendum Note (Signed)
Addended by: Anastasio Auerbach on: 04/01/2019 10:37 AM   Modules accepted: Orders

## 2019-04-01 NOTE — Addendum Note (Signed)
Addended by: Nelva Nay on: 04/01/2019 10:40 AM   Modules accepted: Orders

## 2019-04-01 NOTE — Patient Instructions (Signed)
Follow-up in 1 year for annual exam, sooner if any issues. 

## 2019-04-02 LAB — COMPREHENSIVE METABOLIC PANEL
AG Ratio: 1.5 (calc) (ref 1.0–2.5)
ALT: 7 U/L (ref 6–29)
AST: 13 U/L (ref 10–35)
Albumin: 4.3 g/dL (ref 3.6–5.1)
Alkaline phosphatase (APISO): 44 U/L (ref 31–125)
BUN: 16 mg/dL (ref 7–25)
CO2: 24 mmol/L (ref 20–32)
Calcium: 9.1 mg/dL (ref 8.6–10.2)
Chloride: 103 mmol/L (ref 98–110)
Creat: 0.73 mg/dL (ref 0.50–1.10)
Globulin: 2.8 g/dL (calc) (ref 1.9–3.7)
Glucose, Bld: 95 mg/dL (ref 65–99)
Potassium: 4 mmol/L (ref 3.5–5.3)
Sodium: 136 mmol/L (ref 135–146)
Total Bilirubin: 1 mg/dL (ref 0.2–1.2)
Total Protein: 7.1 g/dL (ref 6.1–8.1)

## 2019-04-02 LAB — CBC WITH DIFFERENTIAL/PLATELET
Absolute Monocytes: 439 cells/uL (ref 200–950)
Basophils Absolute: 40 cells/uL (ref 0–200)
Basophils Relative: 0.7 %
Eosinophils Absolute: 160 cells/uL (ref 15–500)
Eosinophils Relative: 2.8 %
HCT: 40.4 % (ref 35.0–45.0)
Hemoglobin: 13.2 g/dL (ref 11.7–15.5)
Lymphs Abs: 1619 cells/uL (ref 850–3900)
MCH: 30.8 pg (ref 27.0–33.0)
MCHC: 32.7 g/dL (ref 32.0–36.0)
MCV: 94.4 fL (ref 80.0–100.0)
MPV: 10.8 fL (ref 7.5–12.5)
Monocytes Relative: 7.7 %
Neutro Abs: 3443 cells/uL (ref 1500–7800)
Neutrophils Relative %: 60.4 %
Platelets: 242 10*3/uL (ref 140–400)
RBC: 4.28 10*6/uL (ref 3.80–5.10)
RDW: 11.3 % (ref 11.0–15.0)
Total Lymphocyte: 28.4 %
WBC: 5.7 10*3/uL (ref 3.8–10.8)

## 2019-04-02 LAB — LIPID PANEL
Cholesterol: 231 mg/dL — ABNORMAL HIGH (ref <200)
HDL: 83 mg/dL (ref >=50)
LDL Cholesterol (Calc): 133 mg/dL (calc) — ABNORMAL HIGH
Non-HDL Cholesterol (Calc): 148 mg/dL (calc) — ABNORMAL HIGH (ref <130)
Total CHOL/HDL Ratio: 2.8 (calc) (ref <5.0)
Triglycerides: 54 mg/dL (ref <150)

## 2019-04-04 ENCOUNTER — Encounter: Payer: Self-pay | Admitting: Gynecology

## 2019-04-05 ENCOUNTER — Encounter: Payer: Self-pay | Admitting: Gynecology

## 2019-04-05 LAB — PAP IG AND HPV HIGH-RISK: HPV DNA High Risk: NOT DETECTED

## 2019-05-17 ENCOUNTER — Other Ambulatory Visit: Payer: BC Managed Care – PPO

## 2019-08-09 ENCOUNTER — Other Ambulatory Visit: Payer: Self-pay | Admitting: Obstetrics and Gynecology

## 2019-08-09 DIAGNOSIS — N644 Mastodynia: Secondary | ICD-10-CM

## 2019-08-16 ENCOUNTER — Other Ambulatory Visit: Payer: BC Managed Care – PPO

## 2019-11-16 ENCOUNTER — Ambulatory Visit
Admission: RE | Admit: 2019-11-16 | Discharge: 2019-11-16 | Disposition: A | Discharge status: Home / Self Care | Payer: BC Managed Care – PPO | Source: Ambulatory Visit | Attending: Obstetrics and Gynecology | Admitting: Obstetrics and Gynecology

## 2019-11-16 ENCOUNTER — Other Ambulatory Visit: Payer: Self-pay

## 2019-11-16 DIAGNOSIS — N644 Mastodynia: Secondary | ICD-10-CM

## 2020-11-05 ENCOUNTER — Other Ambulatory Visit: Payer: Self-pay | Admitting: Obstetrics and Gynecology

## 2020-11-05 DIAGNOSIS — N63 Unspecified lump in unspecified breast: Secondary | ICD-10-CM

## 2020-12-04 ENCOUNTER — Other Ambulatory Visit: Payer: Self-pay

## 2020-12-04 ENCOUNTER — Ambulatory Visit
Admission: RE | Admit: 2020-12-04 | Discharge: 2020-12-04 | Disposition: A | Discharge status: Home / Self Care | Payer: BC Managed Care – PPO | Source: Ambulatory Visit | Attending: Obstetrics and Gynecology | Admitting: Obstetrics and Gynecology

## 2020-12-04 DIAGNOSIS — N63 Unspecified lump in unspecified breast: Secondary | ICD-10-CM

## 2021-09-18 NOTE — Progress Notes (Signed)
? ?NEW PATIENT ?Date of Service/Encounter:  09/19/21 ?Referring provider: none-self referred ?Primary care provider: Pcp, No ? ?Subjective:  ?Lori Richmond is a 51 y.o. female with a PMHx of polymyalgia rheumatica presenting today for evaluation of chronic rhinitis. ?History obtained from: chart review and patient. ?  ?Chronic rhinitis: started end of July 2022 with sore throat due to viral illness then starting become recurrent.  She has had 2 rounds of antibiotics and 2 of prednisone. Prednisone helps the most.   ?Sore throat comes back very frequently now.  ?Multiple strep  ?She was given cetirizine for one month, and one week after finishing her throat was really painful and sore. ?Once throat becomes sore, she develops nasal congestion and some shortness of breath.  Always told her lungs are clear, but she is choking and coughing.  She does feel it is located in her throat.  Intense and feels hard to get air in when this is happening. ? ?She has seen ENT.  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  She is a mouth breather at night.  Surgery was offered, but she is not interested at this time. ? ?Symptoms include:  sore throat, nasal congestion   ?Occurs year-round ?Potential triggers:  pet dander, dust ?Treatments tried: flonase helps, zyrtec ?Previous allergy testing:  many years ago-25 years ago, had allergies to dust, pollens, grass, pets ?History of reflux/heartburn:  occasionally-Tums as needed, long time ago she was on a daily medicine but over 20 years ago ?Has had multiple rounds of steroids.   ? ?Seen in UC on 08/06/21: PND, coughing, treating with prednisone ?06/26/21: diagnosed with sinusitis [facility] and cefdinir ? ?Other allergy screening: ?Asthma: no ?Food allergy: no ?Medication allergy: no ?Hymenoptera allergy: no ?Urticaria: no ?Eczema:no ? ?Past Medical History: ?Past Medical History:  ?Diagnosis Date  ? Complication of anesthesia   ?  ponv slight  ? Fibroid   ? Pap smear abnormality of cervix/human papillomavirus (HPV) positive 02/2018  ? Negative subtype 16, 18/45.  Recommend follow-up Pap smear/HPV 1 year  ? Polymyalgia rheumatica ([place])   ? seeing rheumatalogist 06/2017  ? ?Medication List:  ?Current Outpatient Medications  ?Medication Sig Dispense Refill  ? Ascorbic Acid (VITAMIN C) 100 MG tablet Take 100 mg by mouth as needed.    ? b complex vitamins capsule Take 1 capsule by mouth daily.    ? cholecalciferol (VITAMIN D) 400 UNITS TABS tablet Take 400 Units by mouth daily.     ? fluticasone (FLONASE) 50 MCG/ACT nasal spray Place 2 sprays into both nostrils daily.    ? IRON PO Take by mouth.    ? megestrol (MEGACE) 20 MG tablet Take one tab po twice daily as needed for bleeding. 30 tablet 2  ? Omega-3 Fatty Acids (FISH OIL PO) Take by mouth.    ? spironolactone (ALDACTONE) 100 MG tablet Take 1 tablet by mouth daily.    ? cetirizine (ZYRTEC) 10 MG tablet Take 10 mg by mouth daily. (Patient not taking: Reported on 09/19/2021)    ? ?No current facility-administered medications for this visit.  ? ?Known Allergies:  ?No Known Allergies ?Past Surgical History: ?Past Surgical History:  ?Procedure Laterality Date  ? AUGMENTATION MAMMAPLASTY Bilateral   ? BREAST EXCISIONAL BIOPSY    ? DILATATION & CURETTAGE/HYSTEROSCOPY WITH MYOSURE N/A 06/16/2017  ? Procedure: [place];  Surgeon: Anastasio Auerbach, MD;  Location: [place];  Service: Gynecology;  Laterality: N/A;  ? INTRAUTERINE DEVICE INSERTION  07/30/2017  ? Mirena  ? LIPOSUCTION    ? Tummy tuck    ? ?Family History: ?Family History  ?Problem Relation Age of Onset  ? Allergic rhinitis Mother   ? Hypertension Father   ? Heart attack Father   ? Breast cancer Maternal Aunt   ? 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  [place]  No previous smoking history.  ? ?ROS:  ?All other systems negative except as noted per HPI. ? ?Objective:  ?Blood pressure 120/74, pulse 97, temperature 98.1 ?F (36.7 ?C), temperature source Temporal, resp. rate 17, height 5' 4"  (1.626 m), weight 171 lb 1.6 oz (77.6 kg), SpO2 98 %. ?Body mass index is 29.37 kg/m?Marland Kitchen ?Physical Exam: ? ?General Appearance:  Alert, cooperative, no distress, appears stated age  ?Head:  Normocephalic, without obvious abnormality, atraumatic  ?Eyes:  Conjunctiva clear, EOM's intact  ?Nose: Nares normal,  septum deviated, hypertrophic turbinates, normal mucosa, and no visible anterior polyps  ?Throat: Lips, tongue normal; teeth and gums normal, normal posterior oropharynx and + cobblestoning  ?Neck: Supple, symmetrical  ?Lungs:   clear to auscultation bilaterally, Respirations unlabored, no coughing  ?Heart:  regular rate and rhythm and no murmur, Appears well perfused  ?Extremities: No edema  ?Skin: Skin color, texture, turgor normal, no rashes or lesions on visualized portions of skin  ?Neurologic: No gross deficits  ? ? ? ?Diagnostics: ?Spirometry:  ?Tracings reviewed. Her effort: Good reproducible efforts. ?FVC: 2.64L (pre), 2.36L  (post) ?FEV1: 1.94L, 70% predicted (pre), 1.91L, 69% predicted (post) ?FEV1/FVC ratio: 90% (pre), 100% (post) ?Interpretation: Spirometry consistent with possible restrictive disease with out bronchodilator response ? ?Skin Testing: Environmental allergy panel and select foods. ? Adequate controls. ?Results discussed with patient/family. ? Airborne Adult Perc - 09/19/21 1457   ? ? Time Antigen Placed 2992   ? Allergen Manufacturer Lavella Hammock   ? Location Back   ? Number of Test 59   ? 1. Control-Buffer 50% Glycerol Negative   ? 2. Control-Histamine 1 mg/ml 4+   ? 3. Albumin saline Negative   ? 4. [place] 3+   ? 5. [place] 4+   ? 6. [place] 4+   ? 7. Greenlawn Blue Negative   ? 8. Meadow  Fescue Negative   ? 9. Perennial Rye 3+   ? 10. Sweet Vernal Negative   ? 11. Timothy 4+   ? 12. Cocklebur 3+   ? 13. Burweed Marshelder 3+   ? 14. Ragweed, short Negative   ? 15. Ragweed, Giant Negative   ? 16. Plantain,  English 3+   ? 17. Lamb's Quarters 3+   ? 18. Sheep Sorrell 3+   ? 19. Rough Pigweed 3+   ? 20. Marsh Elder, Rough 3+   ? 21. Mugwort, Common 3+   ? 22. Ash mix Negative   ? 23. Birch mix 3+   ? 24. Beech American 3+   ? 25. Box, Elder Negative   ? 26. Cedar, red Negative   ? 27. Cottonwood, Eastern 3+   ? 28. Elm mix 3+   ? 29. Hickory 3+   ? 30. Maple mix 3+   ? 31. Oak, [place] mix 3+   ? 32. Pecan Pollen 3+   ?  33. Pine mix Negative   ? 34. Sycamore Eastern 4+   ? 35. Trinity, Black Pollen 3+   ? 36. Alternaria alternata Negative   ? 57. Cladosporium Herbarum Negative   ? 38. Aspergillus mix Negative   ? 39. Penicillium mix Negative   ? 40. Bipolaris sorokiniana (Helminthosporium) Negative   ? 41. Drechslera spicifera (Curvularia) Negative   ? 42. Mucor plumbeus Negative   ? 43. Fusarium moniliforme Negative   ? 44. Aureobasidium pullulans (pullulara) Negative   ? 45. Rhizopus oryzae Negative   ? 46. Botrytis cinera Negative   ? 47. Epicoccum nigrum Negative   ? 48. Phoma betae Negative   ? 49. Candida Albicans Negative   ? 50. Trichophyton mentagrophytes Negative   ? 51. Mite, D Farinae  5,000 AU/ml 2+   ? 52. Mite, D Pteronyssinus  5,000 AU/ml Negative   ? 53. Cat Hair 10,000 BAU/ml 3+   ? 54.  Dog Epithelia Negative   ? 55. Mixed Feathers Negative   ? 56. Horse Epithelia Negative   ? 57. Cockroach, [place] Negative   ? 58. Mouse Negative   ? 59. Tobacco Leaf Negative   ? ?  ?  ? ?  ? ? Intradermal - 09/19/21 1456   ? ? Time Antigen Placed 1500   ? Allergen Manufacturer Lavella Hammock   ? Location Arm   ? Number of Test 8   ? Control Negative   ? Ragweed mix 4+   ? Mold 1 3+   ? Mold 2 4+   ? Mold 3 Negative   ? Mold 4 Negative   ? Dog 4+   ? Cockroach 4+   ? ?  ?  ? ?  ? ? Food Adult Perc - 09/19/21  1500   ? ? Time Antigen Placed 1500   ? Location Back   ? Number of allergen test 3   ? 32. Rye  Negative   ? 33. Hops Negative   ? 36. Saccharomyces Cerevisiae  Negative   ? ?  ?  ? ?  ? ? ?Allergy test

## 2021-09-19 ENCOUNTER — Other Ambulatory Visit: Payer: Self-pay

## 2021-09-19 ENCOUNTER — Encounter: Payer: Self-pay | Admitting: Internal Medicine

## 2021-09-19 ENCOUNTER — Ambulatory Visit: Payer: BC Managed Care – PPO | Admitting: Internal Medicine

## 2021-09-19 VITALS — BP 120/74 | HR 97 | Temp 98.1°F | Resp 17 | Ht 64.0 in | Wt 171.1 lb

## 2021-09-19 DIAGNOSIS — K219 Gastro-esophageal reflux disease without esophagitis: Secondary | ICD-10-CM

## 2021-09-19 DIAGNOSIS — T781XXA Other adverse food reactions, not elsewhere classified, initial encounter: Secondary | ICD-10-CM | POA: Diagnosis not present

## 2021-09-19 DIAGNOSIS — J310 Chronic rhinitis: Secondary | ICD-10-CM | POA: Diagnosis not present

## 2021-09-19 DIAGNOSIS — R053 Chronic cough: Secondary | ICD-10-CM | POA: Diagnosis not present

## 2021-09-19 DIAGNOSIS — J3089 Other allergic rhinitis: Secondary | ICD-10-CM

## 2021-09-19 MED ORDER — MONTELUKAST SODIUM 10 MG PO TABS: 10.0000 mg | ORAL_TABLET | Freq: Every day | ORAL | 3 refills | Status: DC

## 2021-09-19 MED ORDER — FAMOTIDINE 40 MG PO TABS: 40.0000 mg | ORAL_TABLET | Freq: Every day | ORAL | 3 refills | Status: DC

## 2021-09-19 MED ORDER — RYALTRIS 665-25 MCG/ACT NA SUSP
2.0000 | Freq: Two times a day (BID) | NASAL | 3 refills | Status: DC
Start: 2021-09-19 — End: 2021-12-25

## 2021-09-19 MED ORDER — EPINEPHRINE 0.3 MG/0.3ML IJ SOAJ: 0.3000 mg | INTRAMUSCULAR | 1 refills | Status: DC | PRN

## 2021-09-19 NOTE — Patient Instructions (Addendum)
Chronic Rhinitis seasonal and perennial allergic: ?- allergy testing today was positive to grasses, weeds, ragweed, trees, indoor and outdoor molds, dust mites, cat, dog, cockroach ?- allergen avoidance as below ?- consider allergy shots as long term control of your symptoms by teaching your immune system to be more tolerant of your allergy triggers ?- start Ryaltris 2 sprays in each nostril twice a day every day ?- Start Singulair (Montelukast) '10mg'$  nightly. ?- Continue over the counter antihistamine daily or daily as needed.   ?-Your options include Zyrtec (Cetirizine) '10mg'$ , Claritin (Loratadine) '10mg'$ , Allegra (Fexofenadine) '180mg'$ , or Xyzal (Levocetirinze) '5mg'$  ? ?Reflux:  ?-Start Pepcid 40 mg daily ?-Lifestyle modifications as below ? ?Follow-up in 3 months, sooner if needed ?Call your insurance about allergy injections.  If approved can call back for allergy injection appointment ? ?It was a pleasure meeting you today. ? ?Gastroesophageal Reflux Induced Respiratory Disease and Laryngopharyngeal Reflux (LPR): ?Gastroesophageal reflux disease (GERD) is a condition where the contents of the stomach reflux or back up into the esophagus or swallowing tube.  This can result in a variety of clinical symptoms including classic symptoms and atypical symptoms.  Classic symptoms of GERD include: heartburn, chest pain, acid taste in the mouth, and difficulty in swallowing.  Atypical symptoms of GERD include laryngopharyngeal reflux (LPR) and asthma. ? ?LPR occurs when stomach reflux comes all the way up to the throat.  Clinical symptoms include hoarseness, raspy voice, laryngitis, throat clearing, postnasal drip, mucus stuck in the throat, a sensation of a lump in the throat, sore throat, and cough.  Most patients with LPR do not have classic symptoms of GERD. ? ?Asthma can also be triggered by GERD.  The acid stomach fluid can stimulate nerve fibers in the esophagus which can cause an increase in bronchial muscle tone and  narrowing of the airways.  Acid stomach contents may also reflux into the trachea and bronchi of the lungs where it can trigger an asthma attack.  Many people with GERD triggered asthma do not have classic symptoms of GERD. ? ?Diagnosis of LPR and GERD induced asthma is frequently made from a typical history and response to medications.  It may take several months of medications to see a good response.  Occasionally, a 24-hour esophageal pH probe study must be performed. ? ?Treatment of GERD/LPR includes: ?  ?Modification of diet and lifestyle ?Stop smoking ?Avoid overeating and lose weight ?Avoid acidic and fatty foods, chocolate, onions, garlic, peppermint ?Elevate the head of your bed 6 to 8 inches with blocks or wedge ?Medications ?Zantac, Pepcid, Axid, Tagamet ?Prilosec, Prevacid, Aciphex, Protonix, Nexium ?Surgery ? ? ? ? ?Reducing Pollen Exposure ? ?The American Academy of Allergy, Asthma and Immunology suggests the following steps to reduce your exposure to pollen during allergy seasons. ?   ?Do not hang sheets or clothing out to dry; pollen may collect on these items. ?Do not mow lawns or spend time around freshly cut grass; mowing stirs up pollen. ?Keep windows closed at night.  Keep car windows closed while driving. ?Minimize morning activities outdoors, a time when pollen counts are usually at their highest. ?Stay indoors as much as possible when pollen counts or humidity is high and on windy days when pollen tends to remain in the air longer. ?Use air conditioning when possible.  Many air conditioners have filters that trap the pollen spores. ?Use a HEPA room air filter to remove pollen form the indoor air you breathe. ?Control of Mold Allergen  ? ?Mold and  fungi can grow on a variety of surfaces provided certain temperature and moisture conditions exist.  Outdoor molds grow on plants, decaying vegetation and soil.  The major outdoor mold, Alternaria and Cladosporium, are found in very high numbers  during hot and dry conditions.  Generally, a late Summer - Fall peak is seen for common outdoor fungal spores.  Rain will temporarily lower outdoor mold spore count, but counts rise rapidly when the rainy period ends.  The most important indoor molds are Aspergillus and Penicillium.  Dark, humid and poorly ventilated basements are ideal sites for mold growth.  The next most common sites of mold growth are the bathroom and the kitchen. ? ?Outdoor (Seasonal) Mold Control ? ?Use air conditioning and keep windows closed ?Avoid exposure to decaying vegetation. ?Avoid leaf raking. ?Avoid grain handling. ?Consider wearing a face mask if working in moldy areas.  ? ? ?Indoor (Perennial) Mold Control  ? ?Maintain humidity below 50%. ?Clean washable surfaces with 5% bleach solution. ?Remove sources e.g. contaminated carpets. ? ? ?Control of Dog or Cat Allergen ? ?Avoidance is the best way to manage a dog or cat allergy. If you have a dog or cat and are allergic to dog or cats, consider removing the dog or cat from the home. ?If you have a dog or cat but don?t want to find it a new home, or if your family wants a pet even though someone in the household is allergic, here are some strategies that may help keep symptoms at bay: ? ?Keep the pet out of your bedroom and restrict it to only a few rooms. Be advised that keeping the dog or cat in only one room will not limit the allergens to that room. ?Don?t pet, hug or kiss the dog or cat; if you do, wash your hands with soap and water. ?High-efficiency particulate air (HEPA) cleaners run continuously in a bedroom or living room can reduce allergen levels over time. ?Regular use of a high-efficiency vacuum cleaner or a central vacuum can reduce allergen levels. ?Giving your dog or cat a bath at least once a week can reduce airborne allergen. ?DUST MITE AVOIDANCE MEASURES: ? ?There are three main measures that need and can be taken to avoid house dust mites: ? ?Reduce accumulation of  dust in general ?-reduce furniture, clothing, carpeting, books, stuffed animals, especially in bedroom ? ?Separate yourself from the dust ?-use pillow and mattress encasements (can be found at stores such as [employer] or online) ?-avoid direct exposure to air condition flow ?-use a HEPA filter device, especially in the bedroom; you can also use a HEPA filter vacuum cleaner ?-wipe dust with a moist towel instead of a dry towel or broom when cleaning ? ?Decrease mites and/or their secretions ?-wash clothing and linen and stuffed animals at highest temperature possible, at least every 2 weeks ?-stuffed animals can also be placed in a bag and put in a freezer overnight ? ?Despite the above measures, it is impossible to eliminate dust mites or their allergen completely from your home.  With the above measures the burden of mites in your home can be diminished, with the goal of minimizing your allergic symptoms.  Success will be reached only when implementing and using all means together. ?Control of Cockroach Allergen ? ?Cockroach allergen has been identified as an important cause of acute attacks of asthma, especially in urban settings.  There are fifty-five species of cockroach that exist in the [place], however only three,  the [place], [place] and [place] species produce allergen that can affect patients with Asthma.  Allergens can be obtained from fecal particles, egg casings and secretions from cockroaches. ?   ?Remove food sources. ?Reduce access to water. ?Seal access and entry points. ?Spray runways with 0.5-1% Diazinon or Chlorpyrifos ?Blow boric acid power under stoves and refrigerator. ?Place bait stations (hydramethylnon) at feeding sites. ? ? ?

## 2021-09-20 ENCOUNTER — Other Ambulatory Visit: Payer: Self-pay

## 2021-09-20 ENCOUNTER — Telehealth: Payer: Self-pay | Admitting: Internal Medicine

## 2021-09-20 MED ORDER — AZELASTINE-FLUTICASONE 137-50 MCG/ACT NA SUSP
2.0000 | Freq: Two times a day (BID) | NASAL | 5 refills | Status: DC
Start: 2021-09-20 — End: 2021-12-25

## 2021-09-20 MED ORDER — CETIRIZINE HCL 10 MG PO TABS: 10.0000 mg | ORAL_TABLET | Freq: Every day | ORAL | 5 refills | Status: DC

## 2021-09-20 NOTE — Telephone Encounter (Signed)
Pt schduled for April 18th at 130 ?

## 2021-09-20 NOTE — Telephone Encounter (Signed)
Perfect! I will write her Rx. Can we get her scheduled too?  ?

## 2021-09-20 NOTE — Telephone Encounter (Signed)
Patient is stating Dr. Simona Huh spoke with her in reference to starting allergy shots patient states her insurance is stating she would be covered so patient would like to go forward, also patient is aware zyrtec is over the counter but she is requesting an RX be written for this medication also patient was prescribed ryaltris but patient cannot afford this medication asking for a generic Azel/Flutic is possible please advise  ?

## 2021-09-20 NOTE — Telephone Encounter (Signed)
Pt would like to start allergy shots and can I write rx for zyrtec and dymista  ?

## 2021-09-23 ENCOUNTER — Other Ambulatory Visit: Payer: Self-pay | Admitting: Internal Medicine

## 2021-09-23 DIAGNOSIS — J3089 Other allergic rhinitis: Secondary | ICD-10-CM

## 2021-09-23 DIAGNOSIS — J3081 Allergic rhinitis due to animal (cat) (dog) hair and dander: Secondary | ICD-10-CM | POA: Diagnosis not present

## 2021-09-23 NOTE — Telephone Encounter (Signed)
Rx sent. Thanks

## 2021-09-23 NOTE — Progress Notes (Signed)
AIT Rx placed.  ?

## 2021-09-23 NOTE — Progress Notes (Signed)
VIALS EXP 09-24-22 ?

## 2021-09-23 NOTE — Progress Notes (Signed)
Aeroallergen Immunotherapy  ? ?Ordering Provider: Dr. Erin Dennis  ? ?Patient Details  ?Name: Lori Richmond  ?MRN: [id number]  ?Date of Birth: 03/27/1971  ? ?Order 1 of 2  ? ?Vial Label: G-W-T-Dog  ? ?0.3 ml (Volume)  BAU Concentration -- 7 Grass Mix* 100,000 ([place] Blue, Meadow Fescue, Orchard, Perennial Rye, RedTop, Sweet Vernal, Timothy)  ?0.2 ml (Volume)  1:20 Concentration -- [place]  ?0.3 ml (Volume)  BAU Concentration -- [place] 10,000  ?0.2 ml (Volume)  1:20 Concentration -- Johnson  ?0.3 ml (Volume)  1:20 Concentration -- Ragweed Mix  ?0.2 ml (Volume)  1:20 Concentration -- Cocklebur  ?0.2 ml (Volume)  1:20 Concentration -- Burweed Marshelder  ?0.2 ml (Volume)  1:10 Concentration -- Plantain English  ?0.5 ml (Volume)  1:20 Concentration -- Weed Mix*  ?0.5 ml (Volume)  1:20 Concentration -- Eastern 10 Tree Mix (also Sweet Gum)  ?0.2 ml (Volume)  1:10 Concentration -- Pecan Pollen  ?0.2 ml (Volume)  1:20 Concentration -- Walnut, Black Pollen  ?0.5 ml (Volume)  1:10 Concentration -- Dog Epithelia  ? ? ?3.8  ml Extract Subtotal  ?1.2  ml Diluent  ?5.0  ml Maintenance Total  ? ?Schedule:  B  ?Blue Vial (1:100,000): Schedule B (6 doses)  ?Yellow Vial (1:10,000): Schedule B (6 doses)  ?Green Vial (1:1,000): Schedule B (6 doses)  ?Red Vial (1:100): Schedule A (10 doses)  ? ?Special Instructions: B schedule ?

## 2021-09-23 NOTE — Progress Notes (Signed)
Aeroallergen Immunotherapy  ? ?Ordering Provider: Dr. Sigurd Sos  ? ?Patient Details  ?Name: Lori Richmond  ?MRN: [id number]  ?Date of Birth: 04/18/1971  ? ?Order 2 of 2  ? ?Vial Label: [id number]  ? ?0.2 ml (Volume)  1:20 Concentration -- Alternaria alternata  ?0.2 ml (Volume)  1:20 Concentration -- Cladosporium herbarum  ?0.2 ml (Volume)  1:10 Concentration -- Aspergillus mix  ?0.2 ml (Volume)  1:10 Concentration -- Penicillium mix  ?0.5 ml (Volume)  1:10 Concentration -- Cat Hair  ?0.3 ml (Volume)  1:20 Concentration -- Cockroach, [place]  ?0.5 ml (Volume)   AU Concentration -- Mite Mix (DF 5,000 & DP 5,000)  ? ? ?2.1  ml Extract Subtotal  ?2.9  ml Diluent  ?5.0  ml Maintenance Total  ? ?Schedule:  B  ?Blue Vial (1:100,000): Schedule B (6 doses)  ?Yellow Vial (1:10,000): Schedule B (6 doses)  ?Green Vial (1:1,000): Schedule B (6 doses)  ?Red Vial (1:100): Schedule A (10 doses)  ? ?Special Instructions: B protocol ?

## 2021-09-24 DIAGNOSIS — J3089 Other allergic rhinitis: Secondary | ICD-10-CM | POA: Diagnosis not present

## 2021-10-15 ENCOUNTER — Ambulatory Visit: Payer: BC Managed Care – PPO

## 2021-10-16 ENCOUNTER — Ambulatory Visit (INDEPENDENT_AMBULATORY_CARE_PROVIDER_SITE_OTHER): Payer: BC Managed Care – PPO

## 2021-10-16 DIAGNOSIS — J309 Allergic rhinitis, unspecified: Secondary | ICD-10-CM | POA: Diagnosis not present

## 2021-10-16 NOTE — Progress Notes (Signed)
Immunotherapy ? ? ?Patient Details  ?Name: Lori Richmond ?MRN: [id number] ?Date of Birth: July 08, 1970 ? ?10/16/2021 ? ?Lori Richmond started injections for  allergies, she waited in the office for 30 minutes without any reactions ?Following schedule: B  ?Frequency:1 time per week ?Epi-Pen:Epi-Pen Available  ? ?Consent signed and patient instructions given. ? ? ?Lori Richmond ?10/16/2021, 2:00 PM ? ? ?

## 2021-10-22 ENCOUNTER — Ambulatory Visit (INDEPENDENT_AMBULATORY_CARE_PROVIDER_SITE_OTHER): Payer: BC Managed Care – PPO

## 2021-10-22 DIAGNOSIS — J309 Allergic rhinitis, unspecified: Secondary | ICD-10-CM | POA: Diagnosis not present

## 2021-10-30 ENCOUNTER — Ambulatory Visit (INDEPENDENT_AMBULATORY_CARE_PROVIDER_SITE_OTHER): Payer: BC Managed Care – PPO

## 2021-10-30 DIAGNOSIS — J309 Allergic rhinitis, unspecified: Secondary | ICD-10-CM

## 2021-11-05 ENCOUNTER — Ambulatory Visit (INDEPENDENT_AMBULATORY_CARE_PROVIDER_SITE_OTHER): Payer: BC Managed Care – PPO

## 2021-11-05 DIAGNOSIS — J309 Allergic rhinitis, unspecified: Secondary | ICD-10-CM | POA: Diagnosis not present

## 2021-11-11 ENCOUNTER — Ambulatory Visit (INDEPENDENT_AMBULATORY_CARE_PROVIDER_SITE_OTHER): Payer: BC Managed Care – PPO

## 2021-11-11 ENCOUNTER — Other Ambulatory Visit: Payer: Self-pay | Admitting: Obstetrics and Gynecology

## 2021-11-11 DIAGNOSIS — J309 Allergic rhinitis, unspecified: Secondary | ICD-10-CM

## 2021-11-11 DIAGNOSIS — Z1231 Encounter for screening mammogram for malignant neoplasm of breast: Secondary | ICD-10-CM

## 2021-11-19 ENCOUNTER — Ambulatory Visit (INDEPENDENT_AMBULATORY_CARE_PROVIDER_SITE_OTHER): Payer: BC Managed Care – PPO

## 2021-11-19 DIAGNOSIS — J309 Allergic rhinitis, unspecified: Secondary | ICD-10-CM | POA: Diagnosis not present

## 2021-11-27 ENCOUNTER — Ambulatory Visit (INDEPENDENT_AMBULATORY_CARE_PROVIDER_SITE_OTHER): Payer: BC Managed Care – PPO

## 2021-11-27 DIAGNOSIS — J309 Allergic rhinitis, unspecified: Secondary | ICD-10-CM | POA: Diagnosis not present

## 2021-12-06 ENCOUNTER — Ambulatory Visit (INDEPENDENT_AMBULATORY_CARE_PROVIDER_SITE_OTHER): Payer: BC Managed Care – PPO

## 2021-12-06 DIAGNOSIS — J309 Allergic rhinitis, unspecified: Secondary | ICD-10-CM

## 2021-12-09 ENCOUNTER — Ambulatory Visit (INDEPENDENT_AMBULATORY_CARE_PROVIDER_SITE_OTHER): Payer: BC Managed Care – PPO

## 2021-12-09 DIAGNOSIS — J309 Allergic rhinitis, unspecified: Secondary | ICD-10-CM

## 2021-12-10 ENCOUNTER — Ambulatory Visit
Admission: RE | Admit: 2021-12-10 | Discharge: 2021-12-10 | Disposition: A | Discharge status: Home / Self Care | Payer: BC Managed Care – PPO | Source: Ambulatory Visit | Attending: Obstetrics and Gynecology | Admitting: Obstetrics and Gynecology

## 2021-12-10 DIAGNOSIS — Z1231 Encounter for screening mammogram for malignant neoplasm of breast: Secondary | ICD-10-CM

## 2021-12-18 ENCOUNTER — Ambulatory Visit (INDEPENDENT_AMBULATORY_CARE_PROVIDER_SITE_OTHER): Payer: BC Managed Care – PPO

## 2021-12-18 DIAGNOSIS — J309 Allergic rhinitis, unspecified: Secondary | ICD-10-CM | POA: Diagnosis not present

## 2021-12-20 ENCOUNTER — Ambulatory Visit: Payer: BC Managed Care – PPO | Admitting: Internal Medicine

## 2021-12-23 NOTE — Progress Notes (Signed)
FOLLOW UP Date of Service/Encounter:  12/25/21   Subjective:  Lori Richmond (DOB: [birth year]) is a 51 y.o. female who returns to the [facility] on 12/25/2021 in re-evaluation of the following: allergic rhinitis, chronic cough/GERD History obtained from: chart review and patient.  For Review, LV was on 09/19/21  with Dr.Karsyn Jamie seen for intial visit for allergic rhinitis .  We started pepcid daily, ryaltris, singulair, and continue daily AH.   Today presents for follow-up. Tolerating her injections well. She will get some redness at the injection sites, but this only lasts for a few hours to a day.  Otherwise she is tolerating them well.  She feels tired in the afternoon, wonders if secondary to zrytec use daily.   She will be leaving tomorrow to go to [place] for the next 3 weeks and would like to find another antihistamine to try before leaving She has an intense reaction including localized swelling and itching from mosquito bites and it can last for a long time.  Upwards of a few weeks.  Happens with other bug bites as well.  She would like to know if there is anything that can be done about this. She has not been sick or had any more episodes of severe throat pain since starting allergy injections.  ------------------------------------------------------------------------  Pertinent History/Diagnostics:  - Chronic cough-suspected upper airway cough, taking pepcid 40 mg daily  - 09/19/21 spirometry-90% ratio, with 70% FEV1 without improvement postbronchodilator; - Allergic Rhinitis: year round sore throat, nasal congestion; deviated septum  - SPT environmental panel (09/19/21): positive to grasses, weeds, ragweed, trees, indoor and outdoor molds, dust mites, cat, dog, cockroach  - started AIT on 10/16/21: vial 1 (G-RW-W-T-dog) and vial 2 (molds, cat, cockroach, DM)   Allergies as of 12/25/2021   No Known Allergies      Medication List        Accurate as of December 25, 2021  1:10 PM. If you have any questions, ask your nurse or doctor.          STOP taking these medications    Ryaltris 665-25 MCG/ACT Susp Generic drug: Olopatadine-Mometasone Stopped by: Clemon Chambers, MD       TAKE these medications    Azelastine-Fluticasone 137-50 MCG/ACT Susp Place 2 sprays into both nostrils 2 (two) times daily.   b complex vitamins capsule Take 1 capsule by mouth daily.   cetirizine 10 MG tablet Commonly known as: ZYRTEC Take 10 mg by mouth daily.   cholecalciferol 10 MCG (400 UNIT) Tabs tablet Commonly known as: VITAMIN D3 Take 400 Units by mouth daily.   desloratadine 5 MG tablet Commonly known as: Clarinex Take 1 tablet (5 mg total) by mouth daily. Started by: Clemon Chambers, MD   EPINEPHrine 0.3 mg/0.3 mL Soaj injection Commonly known as: EPI-PEN Inject 0.3 mg into the muscle as needed for anaphylaxis.   famotidine 40 MG tablet Commonly known as: Pepcid Take 1 tablet (40 mg total) by mouth daily.   FISH OIL PO Take by mouth.   fluticasone 50 MCG/ACT nasal spray Commonly known as: FLONASE Place 2 sprays into both nostrils daily.   IRON PO Take by mouth.   levonorgestrel 20 MCG/DAY Iud Commonly known as: MIRENA by Intrauterine route.   megestrol 20 MG tablet Commonly known as: MEGACE Take one tab po twice daily as needed for bleeding.   montelukast 10 MG tablet Commonly known as: Singulair Take 1 tablet (10 mg total) by mouth at bedtime.  spironolactone 100 MG tablet Commonly known as: ALDACTONE Take 1 tablet by mouth daily.   vitamin C 100 MG tablet Take 100 mg by mouth as needed.       Past Medical History:  Diagnosis Date   Complication of anesthesia    ponv slight   Fibroid    Pap smear abnormality of cervix/human papillomavirus (HPV) positive 02/2018   Negative subtype 16, 18/45.  Recommend follow-up Pap smear/HPV 1 year   Polymyalgia rheumatica ([place])    seeing rheumatalogist 06/2017   Past Surgical  History:  Procedure Laterality Date   AUGMENTATION MAMMAPLASTY Bilateral    BREAST EXCISIONAL BIOPSY     DILATATION & CURETTAGE/HYSTEROSCOPY WITH MYOSURE N/A 06/16/2017   Procedure: [facility];  Surgeon: Anastasio Auerbach, MD;  Location: [facility];  Service: Gynecology;  Laterality: N/A;   INTRAUTERINE DEVICE INSERTION  07/30/2017   Mirena   LIPOSUCTION     Tummy tuck     Otherwise, there have been no changes to her past medical history, surgical history, family history, or social history.  ROS: All others negative except as noted per HPI.   Objective:  BP 130/88 (BP Location: Right Arm, Patient Position: Sitting, Cuff Size: Normal)   Pulse (!) 104   Temp 98.7 F (37.1 C)   Ht 5' 4"  (1.626 m)   Wt 177 lb (80.3 kg)   SpO2 97%   BMI 30.38 kg/m  Body mass index is 30.38 kg/m. Physical Exam: General Appearance:  Alert, cooperative, no distress, appears stated age  Head:  Normocephalic, without obvious abnormality, atraumatic  Eyes:  Conjunctiva clear, EOM's intact  Nose: Nares normal,  septum slightly deviated, hypertrophic turbinates, normal mucosa, and no visible anterior polyps  Throat: Lips, tongue normal; teeth and gums normal, normal posterior oropharynx and + cobblestoning  Neck: Supple, symmetrical  Lungs:   clear to auscultation bilaterally, Respirations unlabored, no coughing  Heart:  regular rate and rhythm and no murmur, Appears well perfused  Extremities: No edema  Skin: Skin color, texture, turgor normal, no rashes or lesions on visualized portions of skin  Neurologic: No gross deficits   Assessment/Plan   Seasonal and perennial allergic rhinitis-controlled - allergen avoidance towards grasses, weeds, ragweed, trees, indoor and outdoor molds, dust mites, cat, dog, cockroach - continue allergy injections per protocol, continue to bring your epinephrine auto injector to allergy injection appointments.  -  continue azelastine/fluticasone 2 sprays in each nostril twice a day every day - Continue Singulair (Montelukast) 32m nightly. - Start over the counter antihistamine daily or daily as needed.   -Clarinex (desloratadine) 5 mg  Reflux:  - Continue Pepcid 40 mg daily -Lifestyle modifications as below  Mosquito/insect bites Avoidance measures (DEET repellant for mosquitos, long clothing, etc) If bite occurs with raised rash:  - For itch: Topical steroid (hydrocortisone cream) twice daily as needed + oral antihistamine (zyrtec) - For pain and swelling: Oral anti-inflammatory (ibuprofen), ice affected area  Follow-up in 6 months, sooner if needed   ESigurd Sos MD  [facility]

## 2021-12-25 ENCOUNTER — Ambulatory Visit (INDEPENDENT_AMBULATORY_CARE_PROVIDER_SITE_OTHER): Payer: BC Managed Care – PPO | Admitting: Internal Medicine

## 2021-12-25 ENCOUNTER — Encounter: Payer: Self-pay | Admitting: Internal Medicine

## 2021-12-25 VITALS — BP 130/88 | HR 104 | Temp 98.7°F | Ht 64.0 in | Wt 177.0 lb

## 2021-12-25 DIAGNOSIS — W57XXXA Bitten or stung by nonvenomous insect and other nonvenomous arthropods, initial encounter: Secondary | ICD-10-CM

## 2021-12-25 DIAGNOSIS — K219 Gastro-esophageal reflux disease without esophagitis: Secondary | ICD-10-CM | POA: Diagnosis not present

## 2021-12-25 DIAGNOSIS — J309 Allergic rhinitis, unspecified: Secondary | ICD-10-CM | POA: Diagnosis not present

## 2021-12-25 MED ORDER — HYDROCORTISONE 2.5 % EX OINT: TOPICAL_OINTMENT | CUTANEOUS | 3 refills | Status: AC

## 2021-12-25 MED ORDER — MONTELUKAST SODIUM 10 MG PO TABS: 10.0000 mg | ORAL_TABLET | Freq: Every day | ORAL | 3 refills | Status: DC

## 2021-12-25 MED ORDER — AZELASTINE-FLUTICASONE 137-50 MCG/ACT NA SUSP
2.0000 | Freq: Two times a day (BID) | NASAL | 5 refills | Status: DC
Start: 2021-12-25 — End: 2022-07-16

## 2021-12-25 MED ORDER — DESLORATADINE 5 MG PO TABS: 5.0000 mg | ORAL_TABLET | Freq: Every day | ORAL | 5 refills | Status: DC

## 2021-12-25 MED ORDER — FAMOTIDINE 40 MG PO TABS: 40.0000 mg | ORAL_TABLET | Freq: Every day | ORAL | 3 refills | Status: DC

## 2021-12-25 NOTE — Patient Instructions (Addendum)
Seasonal and perennial allergic rhinitis - allergen avoidance towards grasses, weeds, ragweed, trees, indoor and outdoor molds, dust mites, cat, dog, cockroach - continue allergy injections per protocol, continue to bring your epinephrine auto injector to allergy injection appointments.  - continue azelastine/fluticasone 2 sprays in each nostril twice a day every day - Continue Singulair (Montelukast) '10mg'$  nightly. - Start over the counter antihistamine daily or daily as needed.   -Clarinex (desloratadine) 5 mg  Reflux:  - Continue Pepcid 40 mg daily -Lifestyle modifications as below  Mosquito/insect bites Avoidance measures (DEET repellant for mosquitos, long clothing, etc) If bite occurs with raised rash:  - For itch: Topical steroid (hydrocortisone cream) twice daily as needed + oral antihistamine (zyrtec) - For pain and swelling: Oral anti-inflammatory (ibuprofen), ice affected area         Follow-up in 6 months, sooner if needed   It was a pleasure seeing you today.  Gastroesophageal Reflux Induced Respiratory Disease and Laryngopharyngeal Reflux (LPR): Gastroesophageal reflux disease (GERD) is a condition where the contents of the stomach reflux or back up into the esophagus or swallowing tube.  This can result in a variety of clinical symptoms including classic symptoms and atypical symptoms.  Classic symptoms of GERD include: heartburn, chest pain, acid taste in the mouth, and difficulty in swallowing.  Atypical symptoms of GERD include laryngopharyngeal reflux (LPR) and asthma.  LPR occurs when stomach reflux comes all the way up to the throat.  Clinical symptoms include hoarseness, raspy voice, laryngitis, throat clearing, postnasal drip, mucus stuck in the throat, a sensation of a lump in the throat, sore throat, and cough.  Most patients with LPR do not have classic symptoms of GERD.  Asthma can also be triggered by GERD.  The acid stomach fluid can stimulate nerve fibers in  the esophagus which can cause an increase in bronchial muscle tone and narrowing of the airways.  Acid stomach contents may also reflux into the trachea and bronchi of the lungs where it can trigger an asthma attack.  Many people with GERD triggered asthma do not have classic symptoms of GERD.  Diagnosis of LPR and GERD induced asthma is frequently made from a typical history and response to medications.  It may take several months of medications to see a good response.  Occasionally, a 24-hour esophageal pH probe study must be performed.  Treatment of GERD/LPR includes:   Modification of diet and lifestyle Stop smoking Avoid overeating and lose weight Avoid acidic and fatty foods, chocolate, onions, garlic, peppermint Elevate the head of your bed 6 to 8 inches with blocks or wedge Medications Zantac, Pepcid, Axid, Tagamet Prilosec, Prevacid, Aciphex, Protonix, Nexium Surgery     Reducing Pollen Exposure  The American Academy of Allergy, Asthma and Immunology suggests the following steps to reduce your exposure to pollen during allergy seasons.    Do not hang sheets or clothing out to dry; pollen may collect on these items. Do not mow lawns or spend time around freshly cut grass; mowing stirs up pollen. Keep windows closed at night.  Keep car windows closed while driving. Minimize morning activities outdoors, a time when pollen counts are usually at their highest. Stay indoors as much as possible when pollen counts or humidity is high and on windy days when pollen tends to remain in the air longer. Use air conditioning when possible.  Many air conditioners have filters that trap the pollen spores. Use a HEPA room air filter to remove pollen form the indoor  air you breathe. Control of Mold Allergen   Mold and fungi can grow on a variety of surfaces provided certain temperature and moisture conditions exist.  Outdoor molds grow on plants, decaying vegetation and soil.  The major outdoor  mold, Alternaria and Cladosporium, are found in very high numbers during hot and dry conditions.  Generally, a late Summer - Fall peak is seen for common outdoor fungal spores.  Rain will temporarily lower outdoor mold spore count, but counts rise rapidly when the rainy period ends.  The most important indoor molds are Aspergillus and Penicillium.  Dark, humid and poorly ventilated basements are ideal sites for mold growth.  The next most common sites of mold growth are the bathroom and the kitchen.  Outdoor (Seasonal) Mold Control  Use air conditioning and keep windows closed Avoid exposure to decaying vegetation. Avoid leaf raking. Avoid grain handling. Consider wearing a face mask if working in moldy areas.    Indoor (Perennial) Mold Control   Maintain humidity below 50%. Clean washable surfaces with 5% bleach solution. Remove sources e.g. contaminated carpets.   Control of Dog or Cat Allergen  Avoidance is the best way to manage a dog or cat allergy. If you have a dog or cat and are allergic to dog or cats, consider removing the dog or cat from the home. If you have a dog or cat but don't want to find it a new home, or if your family wants a pet even though someone in the household is allergic, here are some strategies that may help keep symptoms at bay:  Keep the pet out of your bedroom and restrict it to only a few rooms. Be advised that keeping the dog or cat in only one room will not limit the allergens to that room. Don't pet, hug or kiss the dog or cat; if you do, wash your hands with soap and water. High-efficiency particulate air (HEPA) cleaners run continuously in a bedroom or living room can reduce allergen levels over time. Regular use of a high-efficiency vacuum cleaner or a central vacuum can reduce allergen levels. Giving your dog or cat a bath at least once a week can reduce airborne allergen. DUST MITE AVOIDANCE MEASURES:  There are three main measures that need and  can be taken to avoid house dust mites:  Reduce accumulation of dust in general -reduce furniture, clothing, carpeting, books, stuffed animals, especially in bedroom  Separate yourself from the dust -use pillow and mattress encasements (can be found at stores such as [employer] or online) -avoid direct exposure to air condition flow -use a HEPA filter device, especially in the bedroom; you can also use a HEPA filter vacuum cleaner -wipe dust with a moist towel instead of a dry towel or broom when cleaning  Decrease mites and/or their secretions -wash clothing and linen and stuffed animals at highest temperature possible, at least every 2 weeks -stuffed animals can also be placed in a bag and put in a freezer overnight  Despite the above measures, it is impossible to eliminate dust mites or their allergen completely from your home.  With the above measures the burden of mites in your home can be diminished, with the goal of minimizing your allergic symptoms.  Success will be reached only when implementing and using all means together. Control of Cockroach Allergen  Cockroach allergen has been identified as an important cause of acute attacks of asthma, especially in urban settings.  There are fifty-five species  of cockroach that exist in the [place], however only three, the [place], [place] and [place] species produce allergen that can affect patients with Asthma.  Allergens can be obtained from fecal particles, egg casings and secretions from cockroaches.    Remove food sources. Reduce access to water. Seal access and entry points. Spray runways with 0.5-1% Diazinon or Chlorpyrifos Blow boric acid power under stoves and refrigerator. Place bait stations (hydramethylnon) at feeding sites.

## 2022-01-15 ENCOUNTER — Ambulatory Visit (INDEPENDENT_AMBULATORY_CARE_PROVIDER_SITE_OTHER): Payer: BC Managed Care – PPO | Admitting: *Deleted

## 2022-01-15 DIAGNOSIS — J309 Allergic rhinitis, unspecified: Secondary | ICD-10-CM | POA: Diagnosis not present

## 2022-01-20 ENCOUNTER — Ambulatory Visit (INDEPENDENT_AMBULATORY_CARE_PROVIDER_SITE_OTHER): Payer: BC Managed Care – PPO

## 2022-01-20 DIAGNOSIS — J309 Allergic rhinitis, unspecified: Secondary | ICD-10-CM | POA: Diagnosis not present

## 2022-01-23 ENCOUNTER — Other Ambulatory Visit: Payer: Self-pay | Admitting: Internal Medicine

## 2022-01-28 ENCOUNTER — Ambulatory Visit (INDEPENDENT_AMBULATORY_CARE_PROVIDER_SITE_OTHER): Payer: BC Managed Care – PPO

## 2022-01-28 DIAGNOSIS — J309 Allergic rhinitis, unspecified: Secondary | ICD-10-CM | POA: Diagnosis not present

## 2022-02-07 ENCOUNTER — Ambulatory Visit (INDEPENDENT_AMBULATORY_CARE_PROVIDER_SITE_OTHER): Payer: BC Managed Care – PPO

## 2022-02-07 DIAGNOSIS — J309 Allergic rhinitis, unspecified: Secondary | ICD-10-CM

## 2022-02-12 ENCOUNTER — Ambulatory Visit (INDEPENDENT_AMBULATORY_CARE_PROVIDER_SITE_OTHER): Payer: BC Managed Care – PPO

## 2022-02-12 DIAGNOSIS — J309 Allergic rhinitis, unspecified: Secondary | ICD-10-CM | POA: Diagnosis not present

## 2022-02-19 ENCOUNTER — Ambulatory Visit (INDEPENDENT_AMBULATORY_CARE_PROVIDER_SITE_OTHER): Payer: BC Managed Care – PPO

## 2022-02-19 DIAGNOSIS — J309 Allergic rhinitis, unspecified: Secondary | ICD-10-CM

## 2022-02-25 ENCOUNTER — Telehealth: Payer: Self-pay

## 2022-02-25 NOTE — Telephone Encounter (Signed)
Hi Lori Richmond-only time will tell us how long she will need to take her allergy medicines in the long run.  Most of my patients do take antihistamines on the day of their shot no matter what.  The goal of allergy injections is 3 to 5 years at a minimal on maintenance. As far as weight gain is concerned, this would not be a side effect from the medications we are prescribing or her allergy injections.  She should consider discussing this with her primary care physician.

## 2022-02-25 NOTE — Telephone Encounter (Signed)
Patient stating that the desloratadine '5mg'$  made her dizzy and dry mouth. She noticed that when she takes allegra 180 mg she doesn't have these side effects,but patient didn't want to have to pay for it. Told patient that I will send it into her pharmacy to see if her insurance bcbs will pay for it and if not she can go to [facility] or [facility]. I did tell patient she will always have to continue to take an allegra 180 mg 1 hour before she receives her allergy injections or take it the night before to keep any local reactions down. Patients question is how long do you think she will have to take her allergy medication while being on her injections? Patient also has noticed her metabolism has slowed down and notice weight gain. She says no matter dieting or cutting back she has noticed weight gain.

## 2022-02-25 NOTE — Telephone Encounter (Signed)
Left patient a detailed message to Dr. Sebastian Ache response to the patient's message I took earlier today. Pt. Stated ok to leave a detail message on her cell phone.

## 2022-02-26 ENCOUNTER — Ambulatory Visit (INDEPENDENT_AMBULATORY_CARE_PROVIDER_SITE_OTHER): Payer: BC Managed Care – PPO | Admitting: *Deleted

## 2022-02-26 DIAGNOSIS — J309 Allergic rhinitis, unspecified: Secondary | ICD-10-CM | POA: Diagnosis not present

## 2022-03-05 ENCOUNTER — Ambulatory Visit (INDEPENDENT_AMBULATORY_CARE_PROVIDER_SITE_OTHER): Payer: BC Managed Care – PPO | Admitting: *Deleted

## 2022-03-05 DIAGNOSIS — J309 Allergic rhinitis, unspecified: Secondary | ICD-10-CM

## 2022-03-20 ENCOUNTER — Ambulatory Visit (INDEPENDENT_AMBULATORY_CARE_PROVIDER_SITE_OTHER): Payer: BC Managed Care – PPO

## 2022-03-20 DIAGNOSIS — J309 Allergic rhinitis, unspecified: Secondary | ICD-10-CM | POA: Diagnosis not present

## 2022-03-28 ENCOUNTER — Ambulatory Visit (INDEPENDENT_AMBULATORY_CARE_PROVIDER_SITE_OTHER): Payer: BC Managed Care – PPO

## 2022-03-28 DIAGNOSIS — J309 Allergic rhinitis, unspecified: Secondary | ICD-10-CM

## 2022-04-03 ENCOUNTER — Ambulatory Visit (INDEPENDENT_AMBULATORY_CARE_PROVIDER_SITE_OTHER): Payer: BC Managed Care – PPO

## 2022-04-03 DIAGNOSIS — J309 Allergic rhinitis, unspecified: Secondary | ICD-10-CM | POA: Diagnosis not present

## 2022-04-10 ENCOUNTER — Ambulatory Visit (INDEPENDENT_AMBULATORY_CARE_PROVIDER_SITE_OTHER): Payer: BC Managed Care – PPO

## 2022-04-10 DIAGNOSIS — J309 Allergic rhinitis, unspecified: Secondary | ICD-10-CM

## 2022-04-16 ENCOUNTER — Ambulatory Visit (INDEPENDENT_AMBULATORY_CARE_PROVIDER_SITE_OTHER): Payer: BC Managed Care – PPO | Admitting: *Deleted

## 2022-04-16 DIAGNOSIS — J309 Allergic rhinitis, unspecified: Secondary | ICD-10-CM | POA: Diagnosis not present

## 2022-04-24 ENCOUNTER — Ambulatory Visit (INDEPENDENT_AMBULATORY_CARE_PROVIDER_SITE_OTHER): Payer: BC Managed Care – PPO

## 2022-04-24 DIAGNOSIS — J309 Allergic rhinitis, unspecified: Secondary | ICD-10-CM

## 2022-05-06 ENCOUNTER — Ambulatory Visit (INDEPENDENT_AMBULATORY_CARE_PROVIDER_SITE_OTHER): Payer: BC Managed Care – PPO

## 2022-05-06 DIAGNOSIS — J309 Allergic rhinitis, unspecified: Secondary | ICD-10-CM | POA: Diagnosis not present

## 2022-05-15 ENCOUNTER — Ambulatory Visit (INDEPENDENT_AMBULATORY_CARE_PROVIDER_SITE_OTHER): Payer: BC Managed Care – PPO | Admitting: *Deleted

## 2022-05-15 DIAGNOSIS — J309 Allergic rhinitis, unspecified: Secondary | ICD-10-CM

## 2022-05-20 ENCOUNTER — Ambulatory Visit (INDEPENDENT_AMBULATORY_CARE_PROVIDER_SITE_OTHER): Payer: BC Managed Care – PPO

## 2022-05-20 DIAGNOSIS — J309 Allergic rhinitis, unspecified: Secondary | ICD-10-CM | POA: Diagnosis not present

## 2022-05-28 ENCOUNTER — Ambulatory Visit (INDEPENDENT_AMBULATORY_CARE_PROVIDER_SITE_OTHER): Payer: BC Managed Care – PPO

## 2022-05-28 DIAGNOSIS — J309 Allergic rhinitis, unspecified: Secondary | ICD-10-CM | POA: Diagnosis not present

## 2022-06-04 ENCOUNTER — Ambulatory Visit (INDEPENDENT_AMBULATORY_CARE_PROVIDER_SITE_OTHER): Payer: BC Managed Care – PPO

## 2022-06-04 DIAGNOSIS — J309 Allergic rhinitis, unspecified: Secondary | ICD-10-CM

## 2022-06-05 ENCOUNTER — Telehealth: Payer: Self-pay

## 2022-06-05 NOTE — Telephone Encounter (Signed)
Patient calling to let us know that she received her allergy injections yesterday in [place]. Patient stated by 1-2 hours she broke out with very intense hives on her neck,back upper legs. She also had hives that weren't as intense on her wrists and both upper and lower arms. Patient called the on call Dr. Who was Dr. Nelva Bush and she instructed the patient to take 2 allegra's yesterday and today. Patient will then go back to allegra once daily. Told patient will put her down to schedule A in which moves at a slower pace. Will let Dr,Dennis know and to see if Dr. Simona Huh will decrease the dose for her next injections.

## 2022-06-05 NOTE — Telephone Encounter (Signed)
Patient is aware that we are going to follow schedule A on red #1 and will start at 0.025 cc per Dr. Simona Huh. I noted it in the allergy injection flow sheet.

## 2022-06-16 ENCOUNTER — Ambulatory Visit (INDEPENDENT_AMBULATORY_CARE_PROVIDER_SITE_OTHER): Payer: BC Managed Care – PPO

## 2022-06-16 DIAGNOSIS — J309 Allergic rhinitis, unspecified: Secondary | ICD-10-CM

## 2022-06-20 ENCOUNTER — Other Ambulatory Visit: Payer: Self-pay | Admitting: Internal Medicine

## 2022-06-20 ENCOUNTER — Other Ambulatory Visit: Payer: Self-pay

## 2022-06-20 MED ORDER — MONTELUKAST SODIUM 10 MG PO TABS: 10.0000 mg | ORAL_TABLET | Freq: Every day | ORAL | 0 refills | Status: DC

## 2022-06-25 ENCOUNTER — Ambulatory Visit (INDEPENDENT_AMBULATORY_CARE_PROVIDER_SITE_OTHER): Payer: BC Managed Care – PPO

## 2022-06-25 DIAGNOSIS — J309 Allergic rhinitis, unspecified: Secondary | ICD-10-CM | POA: Diagnosis not present

## 2022-07-04 ENCOUNTER — Ambulatory Visit (INDEPENDENT_AMBULATORY_CARE_PROVIDER_SITE_OTHER): Payer: BC Managed Care – PPO

## 2022-07-04 DIAGNOSIS — J309 Allergic rhinitis, unspecified: Secondary | ICD-10-CM

## 2022-07-11 ENCOUNTER — Ambulatory Visit (INDEPENDENT_AMBULATORY_CARE_PROVIDER_SITE_OTHER): Payer: BC Managed Care – PPO

## 2022-07-11 DIAGNOSIS — J309 Allergic rhinitis, unspecified: Secondary | ICD-10-CM | POA: Diagnosis not present

## 2022-07-16 ENCOUNTER — Ambulatory Visit: Payer: BC Managed Care – PPO | Admitting: Internal Medicine

## 2022-07-16 ENCOUNTER — Other Ambulatory Visit: Payer: Self-pay | Admitting: Internal Medicine

## 2022-07-18 ENCOUNTER — Telehealth: Payer: Self-pay

## 2022-07-18 ENCOUNTER — Ambulatory Visit (INDEPENDENT_AMBULATORY_CARE_PROVIDER_SITE_OTHER): Payer: BC Managed Care – PPO

## 2022-07-18 DIAGNOSIS — J309 Allergic rhinitis, unspecified: Secondary | ICD-10-CM | POA: Diagnosis not present

## 2022-07-18 NOTE — Telephone Encounter (Signed)
Patient wants to continue to get low dose of allergy injection until she sees Dr. Simona Huh again.   Jacqlyn Krauss [phone]

## 2022-07-18 NOTE — Telephone Encounter (Signed)
Okay-looks like I see her next week.  Did she say why?

## 2022-07-21 NOTE — Progress Notes (Unsigned)
FOLLOW UP Date of Service/Encounter:  07/23/22   Subjective:  Lori Richmond (DOB: 1971-01-18) is a 52 y.o. female who returns to the [facility] on 07/23/2022 in re-evaluation of the following: allergic rhinitis, chronic cough/GERD  History obtained from: chart review and patient.  For Review, LV was on 12/25/21  with Dr.Amer Alcindor seen for routine follow-up. We switched her antihistamine to desloratadine per her request.  Feeling afternoon drowsiness felt secondary to cetirizine. Otherwise she was doing well.  Received a message on 06/05/2022 following 0.3 mL dose of maintenance vial reporting full body hives without other symptoms which was treated with high-dose antihistamines.  Her dose was backed down and she was put on a slow buildup protocol.  She has not progressed past 0.1 mL of the red vial.  Today presents for follow-up. She is doing much better than last year with regards to her allergy. She is using Allegra daily, prefers taking least amount of medicine possible. She feels Allegra gives her less side effects. She did have hives after getting her 0.3 mL of red vial which occurred within 1-2 hours of injection. She did not have associated symptoms. Symptoms resolved with 2 allegra within 24 hours. She is now afraid to build up further. She is having more large local reactions since starting the red vial. It is usually only one arm. She is not sure which arm is having the issues or which vial this corresponds to.  The large local reactions did start before having the hives.   She did have a large local on her last dose (0.1 mL red vial).  She has a steroid ointment which helps with the local reactions. She feels that summer and fall are usually better seasons for her. Winter and spring are the worst. She will also be traveling some over the spring and possibly summer as well. She is overall getting improvement from her injections..   She would like to build-up slower,  she is afraid of having another reaction.  Pertinent History/Diagnostics:  Chronic cough- suspected upper airway cough, taking pepcid 40 mg daily. Cough improved with pepcid - 09/19/21 spirometry-90% ratio, with 70% FEV1 without improvement postbronchodilator; Allergic Rhinitis:  year round sore throat, nasal congestion; deviated septum - SPT environmental panel (09/19/21): positive to grasses, weeds, ragweed, trees, indoor and outdoor molds, dust mites, cat, dog, cockroach - started AIT on 10/16/21: vial 1 (G-[id number]-dog) and vial 2 (molds, cat, cockroach, DM)  Allergies as of 07/23/2022   No Known Allergies      Medication List        Accurate as of July 23, 2022  1:13 PM. If you have any questions, ask your nurse or doctor.          Azelastine-Fluticasone 137-50 MCG/ACT Susp SHAKE LIQUID AND USE 2 SPRAYS IN EACH NOSTRIL TWICE DAILY   b complex vitamins capsule Take 1 capsule by mouth daily.   cetirizine 10 MG tablet Commonly known as: ZYRTEC Take 10 mg by mouth daily.   cholecalciferol 10 MCG (400 UNIT) Tabs tablet Commonly known as: VITAMIN D3 Take 400 Units by mouth daily.   desloratadine 5 MG tablet Commonly known as: Clarinex Take 1 tablet (5 mg total) by mouth daily.   EPINEPHrine 0.3 mg/0.3 mL Soaj injection Commonly known as: EPI-PEN Inject 0.3 mg into the muscle as needed for anaphylaxis.   famotidine 40 MG tablet Commonly known as: PEPCID TAKE 1 TABLET(40 MG) BY MOUTH DAILY   FISH OIL PO Take by  mouth.   hydrocortisone 2.5 % ointment Apply topically twice daily as need to red sandpapery rash.   ibuprofen 200 MG tablet Commonly known as: ADVIL Take 200 mg by mouth as needed.   IRON PO Take by mouth.   levonorgestrel 20 MCG/DAY Iud Commonly known as: MIRENA by Intrauterine route.   megestrol 20 MG tablet Commonly known as: MEGACE Take one tab po twice daily as needed for bleeding.   montelukast 10 MG tablet Commonly known as:  SINGULAIR TAKE 1 TABLET(10 MG) BY MOUTH AT BEDTIME   spironolactone 100 MG tablet Commonly known as: ALDACTONE Take 1 tablet by mouth daily.   vitamin C 100 MG tablet Take 100 mg by mouth as needed.       Past Medical History:  Diagnosis Date   Complication of anesthesia    ponv slight   Fibroid    Pap smear abnormality of cervix/human papillomavirus (HPV) positive 02/2018   Negative subtype 16, 18/45.  Recommend follow-up Pap smear/HPV 1 year   Polymyalgia rheumatica ([place])    seeing rheumatalogist 06/2017   Past Surgical History:  Procedure Laterality Date   AUGMENTATION MAMMAPLASTY Bilateral    BREAST EXCISIONAL BIOPSY     DILATATION & CURETTAGE/HYSTEROSCOPY WITH MYOSURE N/A 06/16/2017   Procedure: [place];  Surgeon: Anastasio Auerbach, MD;  Location: [facility];  Service: Gynecology;  Laterality: N/A;   INTRAUTERINE DEVICE INSERTION  07/30/2017   Mirena   LIPOSUCTION     Tummy tuck     Otherwise, there have been no changes to her past medical history, surgical history, family history, or social history.  ROS: All others negative except as noted per HPI.   Objective:  BP 126/78   Pulse 90   Temp 98.3 F (36.8 C) (Temporal)   Resp 12   Ht '5\' 3"'$  (1.6 m)   Wt 174 lb 6.4 oz (79.1 kg)   SpO2 95%   BMI 30.89 kg/m  Body mass index is 30.89 kg/m. Physical Exam: General Appearance:  Alert, cooperative, no distress, appears stated age  Head:  Normocephalic, without obvious abnormality, atraumatic  Eyes:  Conjunctiva clear, EOM's intact  Nose: Nares normal, hypertrophic turbinates, normal mucosa, and no visible anterior polyps  Throat: Lips, tongue normal; teeth and gums normal, normal posterior oropharynx  Neck: Supple, symmetrical  Lungs:   clear to auscultation bilaterally, Respirations unlabored, no coughing  Heart:  regular rate and rhythm and no murmur, Appears well perfused  Extremities: No edema   Skin: Skin color, texture, turgor normal, no rashes or lesions on visualized portions of skin  Neurologic: No gross deficits   Assessment/Plan   Seasonal and perennial allergic rhinitis-controlled - allergen avoidance towards grasses, weeds, ragweed, trees, indoor and outdoor molds, dust mites, cat, dog, cockroach - continue allergy injections per protocol, continue to bring your epinephrine auto injector to allergy injection appointments.   - will keep you on 0.05 mL dose of red vial and check back in before August 2024, may consider building up again depending on how things are going  - try to keep injection appointments at least one week apart  - always take allegra 180 mg prior to coming for injections - continue azelastine 2 sprays in each nostril twice a day every day - Continue Singulair (Montelukast) '10mg'$  nightly. - Continue Allegra 180 mg daily  Reflux: controlled - Continue Pepcid 40 mg daily -Lifestyle modifications as below  Mosquito/insect bites-stable Avoidance measures (DEET repellant for mosquitos, long  clothing, etc) If bite occurs with raised rash:  - For itch: Topical steroid (hydrocortisone cream) twice daily as needed + oral antihistamine (zyrtec) - For pain and swelling: Oral anti-inflammatory (ibuprofen), ice affected area        Follow-up in  August, sooner if needed   It was a pleasure seeing you today.  Sigurd Sos, MD  [facility]

## 2022-07-21 NOTE — Telephone Encounter (Signed)
She's afraid of having a reaction.

## 2022-07-23 ENCOUNTER — Encounter: Payer: Self-pay | Admitting: Internal Medicine

## 2022-07-23 ENCOUNTER — Other Ambulatory Visit: Payer: Self-pay

## 2022-07-23 ENCOUNTER — Ambulatory Visit: Payer: BC Managed Care – PPO | Admitting: Internal Medicine

## 2022-07-23 VITALS — BP 126/78 | HR 90 | Temp 98.3°F | Resp 12 | Ht 63.0 in | Wt 174.4 lb

## 2022-07-23 DIAGNOSIS — K219 Gastro-esophageal reflux disease without esophagitis: Secondary | ICD-10-CM | POA: Diagnosis not present

## 2022-07-23 DIAGNOSIS — J302 Other seasonal allergic rhinitis: Secondary | ICD-10-CM

## 2022-07-23 DIAGNOSIS — J3089 Other allergic rhinitis: Secondary | ICD-10-CM | POA: Diagnosis not present

## 2022-07-23 MED ORDER — AZELASTINE-FLUTICASONE 137-50 MCG/ACT NA SUSP: NASAL | 5 refills | Status: DC

## 2022-07-23 MED ORDER — EPINEPHRINE 0.3 MG/0.3ML IJ SOAJ: 0.3000 mg | INTRAMUSCULAR | 1 refills | Status: DC | PRN

## 2022-07-23 MED ORDER — MONTELUKAST SODIUM 10 MG PO TABS: ORAL_TABLET | ORAL | 3 refills | Status: AC

## 2022-07-23 NOTE — Patient Instructions (Addendum)
Seasonal and perennial allergic rhinitis- - allergen avoidance towards grasses, weeds, ragweed, trees, indoor and outdoor molds, dust mites, cat, dog, cockroach - continue allergy injections per protocol, continue to bring your epinephrine auto injector to allergy injection appointments.   - will keep you on 0.05 mL dose of red vial and check back in before August 2024, may consider building up again depending on how things are going  - try to keep injection appointments at least one week apart  - always take allegra 180 mg prior to coming for injections - continue azelastine 2 sprays in each nostril twice a day every day - Continue Singulair (Montelukast) '10mg'$  nightly. - Continue Allegra 180 mg daily  Reflux: - Continue Pepcid 40 mg daily -Lifestyle modifications as below  Mosquito/insect bites Avoidance measures (DEET repellant for mosquitos, long clothing, etc) If bite occurs with raised rash:  - For itch: Topical steroid (hydrocortisone cream) twice daily as needed + oral antihistamine (zyrtec) - For pain and swelling: Oral anti-inflammatory (ibuprofen), ice affected area        Follow-up in  August, sooner if needed   It was a pleasure seeing you today.  Gastroesophageal Reflux Induced Respiratory Disease and Laryngopharyngeal Reflux (LPR): Gastroesophageal reflux disease (GERD) is a condition where the contents of the stomach reflux or back up into the esophagus or swallowing tube.  This can result in a variety of clinical symptoms including classic symptoms and atypical symptoms.  Classic symptoms of GERD include: heartburn, chest pain, acid taste in the mouth, and difficulty in swallowing.  Atypical symptoms of GERD include laryngopharyngeal reflux (LPR) and asthma.  LPR occurs when stomach reflux comes all the way up to the throat.  Clinical symptoms include hoarseness, raspy voice, laryngitis, throat clearing, postnasal drip, mucus stuck in the throat, a sensation of a lump in  the throat, sore throat, and cough.  Most patients with LPR do not have classic symptoms of GERD.  Asthma can also be triggered by GERD.  The acid stomach fluid can stimulate nerve fibers in the esophagus which can cause an increase in bronchial muscle tone and narrowing of the airways.  Acid stomach contents may also reflux into the trachea and bronchi of the lungs where it can trigger an asthma attack.  Many people with GERD triggered asthma do not have classic symptoms of GERD.  Diagnosis of LPR and GERD induced asthma is frequently made from a typical history and response to medications.  It may take several months of medications to see a good response.  Occasionally, a 24-hour esophageal pH probe study must be performed.  Treatment of GERD/LPR includes:   Modification of diet and lifestyle Stop smoking Avoid overeating and lose weight Avoid acidic and fatty foods, chocolate, onions, garlic, peppermint Elevate the head of your bed 6 to 8 inches with blocks or wedge Medications Zantac, Pepcid, Axid, Tagamet Prilosec, Prevacid, Aciphex, Protonix, Nexium Surgery     Reducing Pollen Exposure  The American Academy of Allergy, Asthma and Immunology suggests the following steps to reduce your exposure to pollen during allergy seasons.    Do not hang sheets or clothing out to dry; pollen may collect on these items. Do not mow lawns or spend time around freshly cut grass; mowing stirs up pollen. Keep windows closed at night.  Keep car windows closed while driving. Minimize morning activities outdoors, a time when pollen counts are usually at their highest. Stay indoors as much as possible when pollen counts or humidity is high  and on windy days when pollen tends to remain in the air longer. Use air conditioning when possible.  Many air conditioners have filters that trap the pollen spores. Use a HEPA room air filter to remove pollen form the indoor air you breathe. Control of Mold Allergen    Mold and fungi can grow on a variety of surfaces provided certain temperature and moisture conditions exist.  Outdoor molds grow on plants, decaying vegetation and soil.  The major outdoor mold, Alternaria and Cladosporium, are found in very high numbers during hot and dry conditions.  Generally, a late Summer - Fall peak is seen for common outdoor fungal spores.  Rain will temporarily lower outdoor mold spore count, but counts rise rapidly when the rainy period ends.  The most important indoor molds are Aspergillus and Penicillium.  Dark, humid and poorly ventilated basements are ideal sites for mold growth.  The next most common sites of mold growth are the bathroom and the kitchen.  Outdoor (Seasonal) Mold Control  Use air conditioning and keep windows closed Avoid exposure to decaying vegetation. Avoid leaf raking. Avoid grain handling. Consider wearing a face mask if working in moldy areas.    Indoor (Perennial) Mold Control   Maintain humidity below 50%. Clean washable surfaces with 5% bleach solution. Remove sources e.g. contaminated carpets.   Control of Dog or Cat Allergen  Avoidance is the best way to manage a dog or cat allergy. If you have a dog or cat and are allergic to dog or cats, consider removing the dog or cat from the home. If you have a dog or cat but don't want to find it a new home, or if your family wants a pet even though someone in the household is allergic, here are some strategies that may help keep symptoms at bay:  Keep the pet out of your bedroom and restrict it to only a few rooms. Be advised that keeping the dog or cat in only one room will not limit the allergens to that room. Don't pet, hug or kiss the dog or cat; if you do, wash your hands with soap and water. High-efficiency particulate air (HEPA) cleaners run continuously in a bedroom or living room can reduce allergen levels over time. Regular use of a high-efficiency vacuum cleaner or a central  vacuum can reduce allergen levels. Giving your dog or cat a bath at least once a week can reduce airborne allergen. DUST MITE AVOIDANCE MEASURES:  There are three main measures that need and can be taken to avoid house dust mites:  Reduce accumulation of dust in general -reduce furniture, clothing, carpeting, books, stuffed animals, especially in bedroom  Separate yourself from the dust -use pillow and mattress encasements (can be found at stores such as [employer] or online) -avoid direct exposure to air condition flow -use a HEPA filter device, especially in the bedroom; you can also use a HEPA filter vacuum cleaner -wipe dust with a moist towel instead of a dry towel or broom when cleaning  Decrease mites and/or their secretions -wash clothing and linen and stuffed animals at highest temperature possible, at least every 2 weeks -stuffed animals can also be placed in a bag and put in a freezer overnight  Despite the above measures, it is impossible to eliminate dust mites or their allergen completely from your home.  With the above measures the burden of mites in your home can be diminished, with the goal of minimizing your allergic symptoms.  Success will be reached only when implementing and using all means together. Control of Cockroach Allergen  Cockroach allergen has been identified as an important cause of acute attacks of asthma, especially in urban settings.  There are fifty-five species of cockroach that exist in the [place], however only three, the [place], [place] species produce allergen that can affect patients with Asthma.  Allergens can be obtained from fecal particles, egg casings and secretions from cockroaches.    Remove food sources. Reduce access to water. Seal access and entry points. Spray runways with 0.5-1% Diazinon or Chlorpyrifos Blow boric acid power under stoves and refrigerator. Place bait stations (hydramethylnon) at feeding  sites.

## 2022-07-24 NOTE — Progress Notes (Signed)
NEW LABEL PRINTED.

## 2022-07-25 ENCOUNTER — Ambulatory Visit (INDEPENDENT_AMBULATORY_CARE_PROVIDER_SITE_OTHER): Payer: BC Managed Care – PPO

## 2022-07-25 DIAGNOSIS — J309 Allergic rhinitis, unspecified: Secondary | ICD-10-CM | POA: Diagnosis not present

## 2022-07-31 ENCOUNTER — Ambulatory Visit (INDEPENDENT_AMBULATORY_CARE_PROVIDER_SITE_OTHER): Payer: BC Managed Care – PPO

## 2022-07-31 DIAGNOSIS — J309 Allergic rhinitis, unspecified: Secondary | ICD-10-CM

## 2022-08-07 ENCOUNTER — Ambulatory Visit (INDEPENDENT_AMBULATORY_CARE_PROVIDER_SITE_OTHER): Payer: BC Managed Care – PPO

## 2022-08-07 DIAGNOSIS — J309 Allergic rhinitis, unspecified: Secondary | ICD-10-CM | POA: Diagnosis not present

## 2022-08-11 NOTE — Progress Notes (Signed)
VIALS EXP 08-12-23

## 2022-08-12 DIAGNOSIS — J3089 Other allergic rhinitis: Secondary | ICD-10-CM

## 2022-08-13 ENCOUNTER — Ambulatory Visit (INDEPENDENT_AMBULATORY_CARE_PROVIDER_SITE_OTHER): Payer: BC Managed Care – PPO

## 2022-08-13 DIAGNOSIS — J309 Allergic rhinitis, unspecified: Secondary | ICD-10-CM | POA: Diagnosis not present

## 2022-08-19 ENCOUNTER — Ambulatory Visit (INDEPENDENT_AMBULATORY_CARE_PROVIDER_SITE_OTHER): Payer: BC Managed Care – PPO

## 2022-08-19 DIAGNOSIS — J309 Allergic rhinitis, unspecified: Secondary | ICD-10-CM

## 2022-08-27 ENCOUNTER — Ambulatory Visit (INDEPENDENT_AMBULATORY_CARE_PROVIDER_SITE_OTHER): Payer: BC Managed Care – PPO

## 2022-08-27 DIAGNOSIS — J309 Allergic rhinitis, unspecified: Secondary | ICD-10-CM

## 2022-09-03 ENCOUNTER — Ambulatory Visit (INDEPENDENT_AMBULATORY_CARE_PROVIDER_SITE_OTHER): Payer: BC Managed Care – PPO

## 2022-09-03 DIAGNOSIS — J309 Allergic rhinitis, unspecified: Secondary | ICD-10-CM

## 2022-09-10 ENCOUNTER — Ambulatory Visit (INDEPENDENT_AMBULATORY_CARE_PROVIDER_SITE_OTHER): Payer: BC Managed Care – PPO

## 2022-09-10 DIAGNOSIS — J309 Allergic rhinitis, unspecified: Secondary | ICD-10-CM | POA: Diagnosis not present

## 2022-09-17 ENCOUNTER — Ambulatory Visit (INDEPENDENT_AMBULATORY_CARE_PROVIDER_SITE_OTHER): Payer: BC Managed Care – PPO

## 2022-09-17 DIAGNOSIS — J309 Allergic rhinitis, unspecified: Secondary | ICD-10-CM

## 2022-09-25 ENCOUNTER — Ambulatory Visit (INDEPENDENT_AMBULATORY_CARE_PROVIDER_SITE_OTHER): Payer: BC Managed Care – PPO

## 2022-09-25 DIAGNOSIS — J309 Allergic rhinitis, unspecified: Secondary | ICD-10-CM

## 2022-10-03 ENCOUNTER — Ambulatory Visit (INDEPENDENT_AMBULATORY_CARE_PROVIDER_SITE_OTHER): Payer: BC Managed Care – PPO

## 2022-10-03 DIAGNOSIS — J309 Allergic rhinitis, unspecified: Secondary | ICD-10-CM

## 2022-10-09 ENCOUNTER — Ambulatory Visit (INDEPENDENT_AMBULATORY_CARE_PROVIDER_SITE_OTHER): Payer: BC Managed Care – PPO

## 2022-10-09 DIAGNOSIS — J309 Allergic rhinitis, unspecified: Secondary | ICD-10-CM | POA: Diagnosis not present

## 2022-10-15 ENCOUNTER — Ambulatory Visit (INDEPENDENT_AMBULATORY_CARE_PROVIDER_SITE_OTHER): Payer: BC Managed Care – PPO

## 2022-10-15 DIAGNOSIS — J309 Allergic rhinitis, unspecified: Secondary | ICD-10-CM | POA: Diagnosis not present

## 2022-10-22 ENCOUNTER — Ambulatory Visit (INDEPENDENT_AMBULATORY_CARE_PROVIDER_SITE_OTHER): Payer: BC Managed Care – PPO

## 2022-10-22 DIAGNOSIS — J309 Allergic rhinitis, unspecified: Secondary | ICD-10-CM

## 2022-10-31 ENCOUNTER — Ambulatory Visit (INDEPENDENT_AMBULATORY_CARE_PROVIDER_SITE_OTHER): Payer: BC Managed Care – PPO

## 2022-10-31 DIAGNOSIS — J309 Allergic rhinitis, unspecified: Secondary | ICD-10-CM

## 2022-11-04 ENCOUNTER — Ambulatory Visit (INDEPENDENT_AMBULATORY_CARE_PROVIDER_SITE_OTHER): Payer: BC Managed Care – PPO

## 2022-11-04 DIAGNOSIS — J309 Allergic rhinitis, unspecified: Secondary | ICD-10-CM | POA: Diagnosis not present

## 2022-11-12 ENCOUNTER — Ambulatory Visit (INDEPENDENT_AMBULATORY_CARE_PROVIDER_SITE_OTHER): Payer: BC Managed Care – PPO | Admitting: *Deleted

## 2022-11-12 DIAGNOSIS — J309 Allergic rhinitis, unspecified: Secondary | ICD-10-CM

## 2022-12-16 ENCOUNTER — Encounter: Payer: Self-pay | Admitting: Internal Medicine

## 2023-01-14 ENCOUNTER — Telehealth: Payer: Self-pay | Admitting: *Deleted

## 2023-01-14 NOTE — Telephone Encounter (Signed)
Patient was out of town for 2 months and is back in town, she last received 0.05 of Red frozen on 11/12/2022. Please advise for next dose. Thank You.

## 2023-01-14 NOTE — Telephone Encounter (Signed)
I would restart her at green 0.05 mL (so beginning of green vial).

## 2023-01-15 ENCOUNTER — Ambulatory Visit (INDEPENDENT_AMBULATORY_CARE_PROVIDER_SITE_OTHER): Payer: BC Managed Care – PPO

## 2023-01-15 DIAGNOSIS — J309 Allergic rhinitis, unspecified: Secondary | ICD-10-CM

## 2023-01-15 NOTE — Telephone Encounter (Signed)
Hey can you please help me mix down this vial if need be and call and let the patient know she can come in?

## 2023-01-15 NOTE — Progress Notes (Signed)
Immunotherapy   Patient Details  Name: Lori Richmond MRN: [id number] Date of Birth: Oct 29, 1970  01/15/2023  Lori Richmond started injections for  dust mites, cats, cockroaches, molds, dogs, grasses, weeds, and trees. Patient had to back down from her red vial to her green vial as she missed two months of injections due be being out of the country.  Following schedule: A  Frequency:1 time per week Epi-Pen:Epi-Pen Available  Consent signed previously  and patient instructions given. Patient sat in the lobby without an issue before leaving.    Ralene Muskrat 01/15/2023, 6:30 PM

## 2023-01-15 NOTE — Telephone Encounter (Signed)
Vials have been mixed down and patient has been made aware.

## 2023-01-23 ENCOUNTER — Ambulatory Visit (INDEPENDENT_AMBULATORY_CARE_PROVIDER_SITE_OTHER): Payer: BC Managed Care – PPO | Admitting: *Deleted

## 2023-01-23 DIAGNOSIS — J309 Allergic rhinitis, unspecified: Secondary | ICD-10-CM

## 2023-01-28 ENCOUNTER — Ambulatory Visit (INDEPENDENT_AMBULATORY_CARE_PROVIDER_SITE_OTHER): Payer: BC Managed Care – PPO

## 2023-01-28 DIAGNOSIS — J309 Allergic rhinitis, unspecified: Secondary | ICD-10-CM

## 2023-02-02 ENCOUNTER — Ambulatory Visit (INDEPENDENT_AMBULATORY_CARE_PROVIDER_SITE_OTHER): Payer: BC Managed Care – PPO

## 2023-02-02 DIAGNOSIS — J309 Allergic rhinitis, unspecified: Secondary | ICD-10-CM | POA: Diagnosis not present

## 2023-02-13 ENCOUNTER — Ambulatory Visit (INDEPENDENT_AMBULATORY_CARE_PROVIDER_SITE_OTHER): Payer: BC Managed Care – PPO

## 2023-02-13 DIAGNOSIS — J309 Allergic rhinitis, unspecified: Secondary | ICD-10-CM

## 2023-02-18 ENCOUNTER — Ambulatory Visit: Payer: BC Managed Care – PPO | Admitting: Internal Medicine

## 2023-02-19 ENCOUNTER — Ambulatory Visit (INDEPENDENT_AMBULATORY_CARE_PROVIDER_SITE_OTHER): Payer: BC Managed Care – PPO

## 2023-02-19 DIAGNOSIS — J309 Allergic rhinitis, unspecified: Secondary | ICD-10-CM | POA: Diagnosis not present

## 2023-02-23 ENCOUNTER — Ambulatory Visit: Payer: BC Managed Care – PPO | Admitting: Internal Medicine

## 2023-02-23 ENCOUNTER — Encounter: Payer: Self-pay | Admitting: Internal Medicine

## 2023-02-23 ENCOUNTER — Other Ambulatory Visit: Payer: Self-pay

## 2023-02-23 VITALS — BP 120/80 | HR 83 | Temp 98.4°F | Resp 14 | Ht 63.0 in | Wt 174.8 lb

## 2023-02-23 DIAGNOSIS — J3089 Other allergic rhinitis: Secondary | ICD-10-CM | POA: Diagnosis not present

## 2023-02-23 DIAGNOSIS — K219 Gastro-esophageal reflux disease without esophagitis: Secondary | ICD-10-CM | POA: Diagnosis not present

## 2023-02-23 DIAGNOSIS — J302 Other seasonal allergic rhinitis: Secondary | ICD-10-CM

## 2023-02-23 NOTE — Progress Notes (Signed)
FOLLOW UP Date of Service/Encounter:  02/23/23  Subjective:  Lori Richmond (DOB: 07/07/1970) is a 52 y.o. female [facility] in re-evaluation of the following: allergic rhinitis, chronic cough/GERD  History obtained from: chart review and patient.  For Review, LV was on 07/23/22  with Dr.Dashawn Golda seen for routine follow-up. See below for summary of history and diagnostics.   Therapeutic plans/changes recommended: We decided to freeze her at 0.05 mL of her red vial due to having hives after receiving 0.3 mL of red bile and large local reactions on previous doses of red vial. ----------------------------------------------------- Pertinent History/Diagnostics:  Chronic cough- suspected upper airway cough, taking pepcid 40 mg daily. Cough improved with pepcid - 09/19/21 spirometry-90% ratio, with 70% FEV1 without improvement postbronchodilator; Allergic Rhinitis:  year round sore throat, nasal congestion; deviated septum.  Spring and winter tend to be worse seasons. - SPT environmental panel (09/19/21): positive to grasses, weeds, ragweed, trees, indoor and outdoor molds, dust mites, cat, dog, cockroach - started AIT on 10/16/21: vial 1 (G-[id number]) and vial 2 (molds, cat, cockroach, DM) January 2020 for frozen at 0.05 mL of red vial due to hives and large local reactions. --------------------------------------------------- Today presents for follow-up. Since her last visit, she was out of the country for 2 months.  There was a lapse in therapy from May 15 to July 18.  She was restarted on her green vial at 0.05 mL building up on A schedule (0.5 mL increments).  Her most recent injection was 02/19/2023 at 0.3 mL. While in her green vial, she has not had any additional episodes of hives.  She does feel that her allergy symptoms are much improved while on allergy shots and with Allegra as needed.  She did take Allegra daily while traveling to  [place].  She is no longer on any prescription allergy meds. 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 At this time she would like to remain in the green vial, and wait until she returns from her trip in February prior to restarting red vial and trying to build up.  All medications reviewed by clinical staff and updated in chart. No new pertinent medical or surgical history except as noted in HPI.  ROS: All others negative except as noted per HPI.   Objective:  BP 120/80 (BP Location: Right Arm, Patient Position: Sitting, Cuff Size: Normal)   Pulse 83   Temp 98.4 F (36.9 C) (Temporal)   Resp 14   Ht 5\' 3"  (1.6 m)   Wt 174 lb 12.8 oz (79.3 kg)   SpO2 97%   BMI 30.96 kg/m  Body mass index is 30.96 kg/m. Physical Exam: General Appearance:  Alert, cooperative, no distress, appears stated age  Head:  Normocephalic, without obvious abnormality, atraumatic  Eyes:  Conjunctiva clear, EOM's intact  Ears EACs normal bilaterally and normal TMs bilaterally  Nose: Nares normal, hypertrophic turbinates, normal mucosa, and no visible anterior polyps  Throat: Lips, tongue normal; teeth and gums normal, normal posterior oropharynx and + cobblestoning  Neck: Supple, symmetrical  Lungs:   clear to auscultation bilaterally, Respirations unlabored, no coughing  Heart:  regular rate and rhythm and no murmur, Appears well perfused  Extremities: No edema  Skin: Skin color, texture, turgor normal and no rashes or lesions on visualized portions of skin  Neurologic: No gross deficits   Labs:  Lab Orders  No laboratory test(s) ordered today   Assessment/Plan   Seasonal and perennial allergic rhinitis-improving on AIT - allergen avoidance towards grasses, weeds, ragweed, trees, indoor and outdoor molds, dust mites, cat, dog, cockroach - continue allergy injections per protocol, continue to bring your epinephrine auto injector to  allergy injection appointments.   - will build up to 0.5 mL of green vial until around late January/February 2025, at which point will try to build-up in A schedule of red vial   - try to keep injection appointments at least one week apart  - always take allegra 180 mg prior to coming for injections-when we get to red, may need to take 2 allegra daily prior to these appointments. - continue azelastine 2 sprays in each nostril twice a day every day - Continue Allegra 180 mg daily  Reflux: stable - Continue Pepcid 40 mg daily -Lifestyle modifications as below  Mosquito/insect bites Avoidance measures (DEET repellant for mosquitos, long clothing, etc) If bite occurs with raised rash:  - For itch: Topical steroid (hydrocortisone cream) twice daily as needed + oral antihistamine (zyrtec) - For pain and swelling: Oral anti-inflammatory (ibuprofen), ice affected area        Follow-up in 6 months, sooner if needed   Other: allergy injection given in clinic today  Tonny Bollman, MD  [facility]

## 2023-02-23 NOTE — Patient Instructions (Addendum)
Seasonal and perennial allergic rhinitis- - allergen avoidance towards grasses, weeds, ragweed, trees, indoor and outdoor molds, dust mites, cat, dog, cockroach - continue allergy injections per protocol, continue to bring your epinephrine auto injector to allergy injection appointments.   - will build up to 0.5 mL of green vial until around late January/February 2025, at which point will try to build-up in A schedule of red vial   - try to keep injection appointments at least one week apart  - always take allegra 180 mg prior to coming for injections-when we get to red, may need to take 2 allegra daily prior to these appointments. - continue azelastine 2 sprays in each nostril twi she has no ce a day every day - Continue Allegra 180 mg daily  Reflux: - Continue Pepcid 40 mg daily -Lifestyle modifications as below  Mosquito/insect bites Avoidance measures (DEET repellant for mosquitos, long clothing, etc) If bite occurs with raised rash:  - For itch: Topical steroid (hydrocortisone cream) twice daily as needed + oral antihistamine (zyrtec) - For pain and swelling: Oral anti-inflammatory (ibuprofen), ice affected area        Follow-up in 6 months, sooner if needed   It was a pleasure seeing you today.  Gastroesophageal Reflux Induced Respiratory Disease and Laryngopharyngeal Reflux (LPR): Gastroesophageal reflux disease (GERD) is a condition where the contents of the stomach reflux or back up into the esophagus or swallowing tube.  This can result in a variety of clinical symptoms including classic symptoms and atypical symptoms.  Classic symptoms of GERD include: heartburn, chest pain, acid taste in the mouth, and difficulty in swallowing.  Atypical symptoms of GERD include laryngopharyngeal reflux (LPR) and asthma.  LPR occurs when stomach reflux comes all the way up to the throat.  Clinical symptoms include hoarseness, raspy voice, laryngitis, throat clearing, postnasal drip, mucus  stuck in the throat, a sensation of a lump in the throat, sore throat, and cough.  Most patients with LPR do not have classic symptoms of GERD.  Asthma can also be triggered by GERD.  The acid stomach fluid can stimulate nerve fibers in the esophagus which can cause an increase in bronchial muscle tone and narrowing of the airways.  Acid stomach contents may also reflux into the trachea and bronchi of the lungs where it can trigger an asthma attack.  Many people with GERD triggered asthma do not have classic symptoms of GERD.  Diagnosis of LPR and GERD induced asthma is frequently made from a typical history and response to medications.  It may take several months of medications to see a good response.  Occasionally, a 24-hour esophageal pH probe study must be performed.  Treatment of GERD/LPR includes:   Modification of diet and lifestyle Stop smoking Avoid overeating and lose weight Avoid acidic and fatty foods, chocolate, onions, garlic, peppermint Elevate the head of your bed 6 to 8 inches with blocks or wedge Medications Zantac, Pepcid, Axid, Tagamet Prilosec, Prevacid, Aciphex, Protonix, Nexium Surgery     Reducing Pollen Exposure  The American Academy of Allergy, Asthma and Immunology suggests the following steps to reduce your exposure to pollen during allergy seasons.    Do not hang sheets or clothing out to dry; pollen may collect on these items. Do not mow lawns or spend time around freshly cut grass; mowing stirs up pollen. Keep windows closed at night.  Keep car windows closed while driving. Minimize morning activities outdoors, a time when pollen counts are usually at their highest.  Stay indoors as much as possible when pollen counts or humidity is high and on windy days when pollen tends to remain in the air longer. Use air conditioning when possible.  Many air conditioners have filters that trap the pollen spores. Use a HEPA room air filter to remove pollen form the  indoor air you breathe. Control of Mold Allergen   Mold and fungi can grow on a variety of surfaces provided certain temperature and moisture conditions exist.  Outdoor molds grow on plants, decaying vegetation and soil.  The major outdoor mold, Alternaria and Cladosporium, are found in very high numbers during hot and dry conditions.  Generally, a late Summer - Fall peak is seen for common outdoor fungal spores.  Rain will temporarily lower outdoor mold spore count, but counts rise rapidly when the rainy period ends.  The most important indoor molds are Aspergillus and Penicillium.  Dark, humid and poorly ventilated basements are ideal sites for mold growth.  The next most common sites of mold growth are the bathroom and the kitchen.  Outdoor (Seasonal) Mold Control  Use air conditioning and keep windows closed Avoid exposure to decaying vegetation. Avoid leaf raking. Avoid grain handling. Consider wearing a face mask if working in moldy areas.    Indoor (Perennial) Mold Control   Maintain humidity below 50%. Clean washable surfaces with 5% bleach solution. Remove sources e.g. contaminated carpets.   Control of Dog or Cat Allergen  Avoidance is the best way to manage a dog or cat allergy. If you have a dog or cat and are allergic to dog or cats, consider removing the dog or cat from the home. If you have a dog or cat but don't want to find it a new home, or if your family wants a pet even though someone in the household is allergic, here are some strategies that may help keep symptoms at bay:  Keep the pet out of your bedroom and restrict it to only a few rooms. Be advised that keeping the dog or cat in only one room will not limit the allergens to that room. Don't pet, hug or kiss the dog or cat; if you do, wash your hands with soap and water. High-efficiency particulate air (HEPA) cleaners run continuously in a bedroom or living room can reduce allergen levels over time. Regular use  of a high-efficiency vacuum cleaner or a central vacuum can reduce allergen levels. Giving your dog or cat a bath at least once a week can reduce airborne allergen. DUST MITE AVOIDANCE MEASURES:  There are three main measures that need and can be taken to avoid house dust mites:  Reduce accumulation of dust in general -reduce furniture, clothing, carpeting, books, stuffed animals, especially in bedroom  Separate yourself from the dust -use pillow and mattress encasements (can be found at stores such as [employer] or online) -avoid direct exposure to air condition flow -use a HEPA filter device, especially in the bedroom; you can also use a HEPA filter vacuum cleaner -wipe dust with a moist towel instead of a dry towel or broom when cleaning  Decrease mites and/or their secretions -wash clothing and linen and stuffed animals at highest temperature possible, at least every 2 weeks -stuffed animals can also be placed in a bag and put in a freezer overnight  Despite the above measures, it is impossible to eliminate dust mites or their allergen completely from your home.  With the above measures the burden of mites in  your home can be diminished, with the goal of minimizing your allergic symptoms.  Success will be reached only when implementing and using all means together. Control of Cockroach Allergen  Cockroach allergen has been identified as an important cause of acute attacks of asthma, especially in urban settings.  There are fifty-five species of cockroach that exist in the [place], however only three, the [place], [place] species produce allergen that can affect patients with Asthma.  Allergens can be obtained from fecal particles, egg casings and secretions from cockroaches.    Remove food sources. Reduce access to water. Seal access and entry points. Spray runways with 0.5-1% Diazinon or Chlorpyrifos Blow boric acid power under stoves and  refrigerator. Place bait stations (hydramethylnon) at feeding sites.

## 2023-03-12 ENCOUNTER — Ambulatory Visit (INDEPENDENT_AMBULATORY_CARE_PROVIDER_SITE_OTHER): Payer: BC Managed Care – PPO | Admitting: *Deleted

## 2023-03-12 DIAGNOSIS — J309 Allergic rhinitis, unspecified: Secondary | ICD-10-CM | POA: Diagnosis not present

## 2023-03-16 ENCOUNTER — Ambulatory Visit (INDEPENDENT_AMBULATORY_CARE_PROVIDER_SITE_OTHER): Payer: BC Managed Care – PPO | Admitting: *Deleted

## 2023-03-16 DIAGNOSIS — J309 Allergic rhinitis, unspecified: Secondary | ICD-10-CM | POA: Diagnosis not present

## 2023-03-26 ENCOUNTER — Ambulatory Visit (INDEPENDENT_AMBULATORY_CARE_PROVIDER_SITE_OTHER): Payer: BC Managed Care – PPO | Admitting: *Deleted

## 2023-03-26 DIAGNOSIS — J309 Allergic rhinitis, unspecified: Secondary | ICD-10-CM

## 2023-04-02 ENCOUNTER — Ambulatory Visit (INDEPENDENT_AMBULATORY_CARE_PROVIDER_SITE_OTHER): Payer: BC Managed Care – PPO

## 2023-04-02 DIAGNOSIS — J309 Allergic rhinitis, unspecified: Secondary | ICD-10-CM | POA: Diagnosis not present

## 2023-04-07 ENCOUNTER — Ambulatory Visit (INDEPENDENT_AMBULATORY_CARE_PROVIDER_SITE_OTHER): Payer: BC Managed Care – PPO | Admitting: *Deleted

## 2023-04-07 DIAGNOSIS — J309 Allergic rhinitis, unspecified: Secondary | ICD-10-CM

## 2023-04-14 ENCOUNTER — Ambulatory Visit (INDEPENDENT_AMBULATORY_CARE_PROVIDER_SITE_OTHER): Payer: BC Managed Care – PPO

## 2023-04-14 DIAGNOSIS — J309 Allergic rhinitis, unspecified: Secondary | ICD-10-CM

## 2023-04-28 ENCOUNTER — Ambulatory Visit (INDEPENDENT_AMBULATORY_CARE_PROVIDER_SITE_OTHER): Payer: Self-pay | Admitting: *Deleted

## 2023-04-28 DIAGNOSIS — J309 Allergic rhinitis, unspecified: Secondary | ICD-10-CM | POA: Diagnosis not present

## 2023-05-07 ENCOUNTER — Ambulatory Visit (INDEPENDENT_AMBULATORY_CARE_PROVIDER_SITE_OTHER): Payer: BC Managed Care – PPO

## 2023-05-07 DIAGNOSIS — J309 Allergic rhinitis, unspecified: Secondary | ICD-10-CM

## 2023-05-12 ENCOUNTER — Ambulatory Visit (INDEPENDENT_AMBULATORY_CARE_PROVIDER_SITE_OTHER): Payer: BC Managed Care – PPO | Admitting: *Deleted

## 2023-05-12 DIAGNOSIS — J309 Allergic rhinitis, unspecified: Secondary | ICD-10-CM

## 2023-05-19 ENCOUNTER — Ambulatory Visit (INDEPENDENT_AMBULATORY_CARE_PROVIDER_SITE_OTHER): Payer: Self-pay | Admitting: *Deleted

## 2023-05-19 DIAGNOSIS — J309 Allergic rhinitis, unspecified: Secondary | ICD-10-CM | POA: Diagnosis not present

## 2023-05-22 ENCOUNTER — Other Ambulatory Visit: Payer: Self-pay | Admitting: Internal Medicine

## 2023-05-26 ENCOUNTER — Ambulatory Visit (INDEPENDENT_AMBULATORY_CARE_PROVIDER_SITE_OTHER): Payer: BC Managed Care – PPO

## 2023-05-26 DIAGNOSIS — J309 Allergic rhinitis, unspecified: Secondary | ICD-10-CM | POA: Diagnosis not present

## 2023-06-02 ENCOUNTER — Ambulatory Visit (INDEPENDENT_AMBULATORY_CARE_PROVIDER_SITE_OTHER): Payer: BC Managed Care – PPO | Admitting: *Deleted

## 2023-06-02 DIAGNOSIS — J309 Allergic rhinitis, unspecified: Secondary | ICD-10-CM

## 2023-07-08 DIAGNOSIS — J3089 Other allergic rhinitis: Secondary | ICD-10-CM | POA: Diagnosis not present

## 2023-07-09 NOTE — Progress Notes (Signed)
 VIALS EXP 07-08-24

## 2023-07-13 ENCOUNTER — Ambulatory Visit (INDEPENDENT_AMBULATORY_CARE_PROVIDER_SITE_OTHER): Payer: Self-pay | Admitting: *Deleted

## 2023-07-13 DIAGNOSIS — J309 Allergic rhinitis, unspecified: Secondary | ICD-10-CM | POA: Diagnosis not present

## 2023-07-21 ENCOUNTER — Ambulatory Visit (INDEPENDENT_AMBULATORY_CARE_PROVIDER_SITE_OTHER): Payer: 59 | Admitting: *Deleted

## 2023-07-21 DIAGNOSIS — J309 Allergic rhinitis, unspecified: Secondary | ICD-10-CM | POA: Diagnosis not present

## 2023-07-28 ENCOUNTER — Ambulatory Visit (INDEPENDENT_AMBULATORY_CARE_PROVIDER_SITE_OTHER): Payer: Self-pay | Admitting: *Deleted

## 2023-07-28 DIAGNOSIS — J309 Allergic rhinitis, unspecified: Secondary | ICD-10-CM | POA: Diagnosis not present

## 2023-08-04 ENCOUNTER — Ambulatory Visit (INDEPENDENT_AMBULATORY_CARE_PROVIDER_SITE_OTHER): Payer: Self-pay

## 2023-08-04 DIAGNOSIS — J309 Allergic rhinitis, unspecified: Secondary | ICD-10-CM

## 2023-08-10 ENCOUNTER — Ambulatory Visit (INDEPENDENT_AMBULATORY_CARE_PROVIDER_SITE_OTHER): Payer: Self-pay | Admitting: *Deleted

## 2023-08-10 DIAGNOSIS — J309 Allergic rhinitis, unspecified: Secondary | ICD-10-CM | POA: Diagnosis not present

## 2023-08-18 ENCOUNTER — Ambulatory Visit (INDEPENDENT_AMBULATORY_CARE_PROVIDER_SITE_OTHER): Payer: Self-pay | Admitting: *Deleted

## 2023-08-18 DIAGNOSIS — J309 Allergic rhinitis, unspecified: Secondary | ICD-10-CM

## 2023-08-20 ENCOUNTER — Other Ambulatory Visit: Payer: Self-pay | Admitting: Internal Medicine

## 2023-08-20 ENCOUNTER — Telehealth: Payer: Self-pay | Admitting: Internal Medicine

## 2023-08-20 MED ORDER — FAMOTIDINE 40 MG PO TABS: 40.0000 mg | ORAL_TABLET | Freq: Every day | ORAL | 0 refills | Status: AC

## 2023-08-20 NOTE — Addendum Note (Signed)
 Addended by: Berna Bue on: 08/20/2023 04:22 PM   Modules accepted: Orders

## 2023-08-20 NOTE — Telephone Encounter (Signed)
 I have sent in a curtesy refill can you please let pt know she is over due for an appt and that's why it was originally denied

## 2023-08-20 NOTE — Telephone Encounter (Signed)
 Patient called and stated she needed a refill on her Famotidine. She stated she wants that sent over to [facility] on [address].  Patient would like a call back at [phone]

## 2023-08-26 ENCOUNTER — Ambulatory Visit (INDEPENDENT_AMBULATORY_CARE_PROVIDER_SITE_OTHER): Payer: Self-pay | Admitting: *Deleted

## 2023-08-26 DIAGNOSIS — J309 Allergic rhinitis, unspecified: Secondary | ICD-10-CM | POA: Diagnosis not present

## 2023-09-01 ENCOUNTER — Ambulatory Visit (INDEPENDENT_AMBULATORY_CARE_PROVIDER_SITE_OTHER): Payer: Self-pay

## 2023-09-01 DIAGNOSIS — J309 Allergic rhinitis, unspecified: Secondary | ICD-10-CM

## 2023-09-10 ENCOUNTER — Ambulatory Visit (INDEPENDENT_AMBULATORY_CARE_PROVIDER_SITE_OTHER): Admitting: *Deleted

## 2023-09-10 DIAGNOSIS — J309 Allergic rhinitis, unspecified: Secondary | ICD-10-CM | POA: Diagnosis not present

## 2023-09-17 ENCOUNTER — Ambulatory Visit (INDEPENDENT_AMBULATORY_CARE_PROVIDER_SITE_OTHER): Payer: Self-pay

## 2023-09-17 DIAGNOSIS — J309 Allergic rhinitis, unspecified: Secondary | ICD-10-CM

## 2023-09-21 ENCOUNTER — Encounter: Payer: Self-pay | Admitting: Internal Medicine

## 2023-09-21 ENCOUNTER — Other Ambulatory Visit: Payer: Self-pay

## 2023-09-21 ENCOUNTER — Ambulatory Visit: Payer: 59 | Admitting: Internal Medicine

## 2023-09-21 VITALS — BP 122/88 | HR 109 | Temp 99.5°F | Resp 19

## 2023-09-21 DIAGNOSIS — J302 Other seasonal allergic rhinitis: Secondary | ICD-10-CM | POA: Diagnosis not present

## 2023-09-21 DIAGNOSIS — J3089 Other allergic rhinitis: Secondary | ICD-10-CM | POA: Diagnosis not present

## 2023-09-21 DIAGNOSIS — K219 Gastro-esophageal reflux disease without esophagitis: Secondary | ICD-10-CM

## 2023-09-21 NOTE — Patient Instructions (Addendum)
 Seasonal and perennial allergic rhinitis- - allergen avoidance towards grasses, weeds, ragweed, trees, indoor and outdoor molds, dust mites, cat, dog, cockroach - continue allergy injections per protocol, continue to bring your epinephrine auto injector to allergy injection appointments.   - will build up to 0.5 mL of green vial   - try to keep injection appointments at least one week apart  - always take allegra 180 mg prior to coming for injections - start saline spray prior to medicated nasal sprays and as needed.  - start nasacort 1 spray in each nostril twice daily  - continue azelastine 2 sprays in each nostril twice daily as needed. - Continue Allegra 180 mg daily  Reflux: - Continue Pepcid 40 mg daily -Lifestyle modifications as below  Mosquito/insect bites Avoidance measures (DEET repellant for mosquitos, long clothing, etc) If bite occurs with raised rash:  - For itch: Topical steroid (hydrocortisone cream) twice daily as needed + oral antihistamine (zyrtec) - For pain and swelling: Oral anti-inflammatory (ibuprofen), ice affected area        Follow-up in 6 months, sooner if needed It was a pleasure seeing you again in clinic today! Thank you for allowing me to participate in your care.  Lori Bollman, MD [facility]    It was a pleasure seeing you today.  Gastroesophageal Reflux Induced Respiratory Disease and Laryngopharyngeal Reflux (LPR): Gastroesophageal reflux disease (GERD) is a condition where the contents of the stomach reflux or back up into the esophagus or swallowing tube.  This can result in a variety of clinical symptoms including classic symptoms and atypical symptoms.  Classic symptoms of GERD include: heartburn, chest pain, acid taste in the mouth, and difficulty in swallowing.  Atypical symptoms of GERD include laryngopharyngeal reflux (LPR) and asthma.  LPR occurs when stomach reflux comes all the way up to the throat.  Clinical  symptoms include hoarseness, raspy voice, laryngitis, throat clearing, postnasal drip, mucus stuck in the throat, a sensation of a lump in the throat, sore throat, and cough.  Most patients with LPR do not have classic symptoms of GERD.  Asthma can also be triggered by GERD.  The acid stomach fluid can stimulate nerve fibers in the esophagus which can cause an increase in bronchial muscle tone and narrowing of the airways.  Acid stomach contents may also reflux into the trachea and bronchi of the lungs where it can trigger an asthma attack.  Many people with GERD triggered asthma do not have classic symptoms of GERD.  Diagnosis of LPR and GERD induced asthma is frequently made from a typical history and response to medications.  It may take several months of medications to see a good response.  Occasionally, a 24-hour esophageal pH probe study must be performed.  Treatment of GERD/LPR includes:   Modification of diet and lifestyle Stop smoking Avoid overeating and lose weight Avoid acidic and fatty foods, chocolate, onions, garlic, peppermint Elevate the head of your bed 6 to 8 inches with blocks or wedge Medications Zantac, Pepcid, Axid, Tagamet Prilosec, Prevacid, Aciphex, Protonix, Nexium Surgery     Reducing Pollen Exposure  The American Academy of Allergy, Asthma and Immunology suggests the following steps to reduce your exposure to pollen during allergy seasons.    Do not hang sheets or clothing out to dry; pollen may collect on these items. Do not mow lawns or spend time around freshly cut grass; mowing stirs up pollen. Keep windows closed at night.  Keep car windows  closed while driving. Minimize morning activities outdoors, a time when pollen counts are usually at their highest. Stay indoors as much as possible when pollen counts or humidity is high and on windy days when pollen tends to remain in the air longer. Use air conditioning when possible.  Many air conditioners have  filters that trap the pollen spores. Use a HEPA room air filter to remove pollen form the indoor air you breathe. Control of Mold Allergen   Mold and fungi can grow on a variety of surfaces provided certain temperature and moisture conditions exist.  Outdoor molds grow on plants, decaying vegetation and soil.  The major outdoor mold, Alternaria and Cladosporium, are found in very high numbers during hot and dry conditions.  Generally, a late Summer - Fall peak is seen for common outdoor fungal spores.  Rain will temporarily lower outdoor mold spore count, but counts rise rapidly when the rainy period ends.  The most important indoor molds are Aspergillus and Penicillium.  Dark, humid and poorly ventilated basements are ideal sites for mold growth.  The next most common sites of mold growth are the bathroom and the kitchen.  Outdoor (Seasonal) Mold Control  Use air conditioning and keep windows closed Avoid exposure to decaying vegetation. Avoid leaf raking. Avoid grain handling. Consider wearing a face mask if working in moldy areas.    Indoor (Perennial) Mold Control   Maintain humidity below 50%. Clean washable surfaces with 5% bleach solution. Remove sources e.g. contaminated carpets.   Control of Dog or Cat Allergen  Avoidance is the best way to manage a dog or cat allergy. If you have a dog or cat and are allergic to dog or cats, consider removing the dog or cat from the home. If you have a dog or cat but don't want to find it a new home, or if your family wants a pet even though someone in the household is allergic, here are some strategies that may help keep symptoms at bay:  Keep the pet out of your bedroom and restrict it to only a few rooms. Be advised that keeping the dog or cat in only one room will not limit the allergens to that room. Don't pet, hug or kiss the dog or cat; if you do, wash your hands with soap and water. High-efficiency particulate air (HEPA) cleaners run  continuously in a bedroom or living room can reduce allergen levels over time. Regular use of a high-efficiency vacuum cleaner or a central vacuum can reduce allergen levels. Giving your dog or cat a bath at least once a week can reduce airborne allergen. DUST MITE AVOIDANCE MEASURES:  There are three main measures that need and can be taken to avoid house dust mites:  Reduce accumulation of dust in general -reduce furniture, clothing, carpeting, books, stuffed animals, especially in bedroom  Separate yourself from the dust -use pillow and mattress encasements (can be found at stores such as [employer] or online) -avoid direct exposure to air condition flow -use a HEPA filter device, especially in the bedroom; you can also use a HEPA filter vacuum cleaner -wipe dust with a moist towel instead of a dry towel or broom when cleaning  Decrease mites and/or their secretions -wash clothing and linen and stuffed animals at highest temperature possible, at least every 2 weeks -stuffed animals can also be placed in a bag and put in a freezer overnight  Despite the above measures, it is impossible to eliminate dust mites  or their allergen completely from your home.  With the above measures the burden of mites in your home can be diminished, with the goal of minimizing your allergic symptoms.  Success will be reached only when implementing and using all means together. Control of Cockroach Allergen  Cockroach allergen has been identified as an important cause of acute attacks of asthma, especially in urban settings.  There are fifty-five species of cockroach that exist in the [place], however only three, the [place], [place] species produce allergen that can affect patients with Asthma.  Allergens can be obtained from fecal particles, egg casings and secretions from cockroaches.    Remove food sources. Reduce access to water. Seal access and entry points. Spray runways  with 0.5-1% Diazinon or Chlorpyrifos Blow boric acid power under stoves and refrigerator. Place bait stations (hydramethylnon) at feeding sites.

## 2023-09-21 NOTE — Progress Notes (Signed)
 FOLLOW UP Date of Service/Encounter:  09/21/23  Subjective:  Lori Richmond (DOB: April 03, 1971) is a 53 y.o. female who [facility] in re-evaluation of the following: Chronic cough suspected upper airway, allergic rhinitis on AIT History obtained from: chart review and patient.  For Review, LV was on 02/23/23  with Dr.Delesia Martinek seen for routine follow-up. See below for summary of history and diagnostics.   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   ----------------------------------------------------- Pertinent History/Diagnostics:  Chronic cough- suspected upper airway cough, taking pepcid 40 mg daily. Cough improved with pepcid - 09/19/21 spirometry-90% ratio, with 70% FEV1 without improvement postbronchodilator; Allergic Rhinitis:  year round sore throat, nasal congestion; deviated septum.  Spring and winter tend to be worse seasons. - SPT environmental panel (09/19/21): positive to grasses, weeds, ragweed, trees, indoor and outdoor molds, dust mites, cat, dog, cockroach - started AIT on 10/16/21: vial 1 (G-RW-W-T-dog) and vial 2 (molds, cat, cockroach, DM) January 2020 for frozen at 0.05 mL of red vial due to hives and large local reactions. --------------------------------------------------- Today presents for follow-up. Discussed the use of AI scribe software for clinical note transcription with the patient, who gave verbal consent to proceed.  History of Present Illness   Lori Richmond is a 53 year old female with allergic rhinitis who presents with persistent nasal congestion and postnasal drip.  She has been experiencing persistent nasal congestion and postnasal drip since returning from a trip to [facility] in mid-February. Initially, she had a burning sensation due to smoke exposure in a hotel, followed by fever, muscle aches, and a sore throat for two days. The sore throat  transitioned to congestion, which has persisted since then.  She did not undergo testing for flu or COVID-19 as her fever and sore throat resolved within a few days. However, she continues to feel weak and experiences headaches. No ear pain is present, but she has a dry cough, likely due to the drainage she does not currently feel feverish, although her temp at check-in was 99.5.  She has been using Allegra daily and azelastine nasal spray twice a day, which have provided some relief. The congestion is primarily in the nasal area and is accompanied by postnasal drip, which irritates her throat and causes a hoarse voice and wheezing when speaking. She has not used saline spray but is considering it to help with congestion.  She has a history of intense allergies, which were less severe two years ago. Her current symptoms are consistent with her known allergic rhinitis, exacerbated by recent exposure to smoke and possibly a viral infection.  She also mentions a history of lower back disc degeneration and recent retinal tear procedures, but she does not use medication for these conditions.   She will continue to travel this year with plans to visit her home country for 2 months over the summer.  She would like to stay in the green vial as she does notice significant improvement in her symptoms in this vial.  She was having adverse reactions in the red vial of localized hives and increased allergy symptoms.  Until her recent trip to [facility] she was well-controlled on her current regimen.  She prefers to avoid glucocorticosteroids to prevent adverse events.     Chart Review: Last injection on 09/17/23-received 0.25 mL of green vial  All medications reviewed by clinical staff and updated in chart. No new pertinent medical or  surgical history except as noted in HPI.  ROS: All others negative except as noted per HPI.   Objective:  BP 122/88 (BP Location: Right Arm, Patient Position: Sitting, Cuff  Size: Normal)   Pulse (!) 109   Temp 99.5 F (37.5 C) (Temporal)   Resp 19   SpO2 98%  There is no height or weight on file to calculate BMI. Physical Exam: General Appearance:  Alert, cooperative, no distress, appears stated age  Head:  Normocephalic, without obvious abnormality, atraumatic  Eyes:  Conjunctiva clear, EOM's intact  Ears EACs normal bilaterally and normal TMs bilaterally  Nose: Nares normal, hypertrophic turbinates, normal mucosa, no visible anterior polyps, and septum midline  Throat: Lips, tongue normal; teeth and gums normal, normal posterior oropharynx  Neck: Supple, symmetrical  Lungs:   clear to auscultation bilaterally, Respirations unlabored, no coughing  Heart:  regular rate and rhythm and no murmur, Appears well perfused  Extremities: No edema  Skin: Skin color, texture, turgor normal and no rashes or lesions on visualized portions of skin  Neurologic: No gross deficits   Labs:  Lab Orders  No laboratory test(s) ordered today   Assessment/Plan   Seasonal and perennial allergic rhinitis-not at goal, increased symptoms following URI, not completely resolved but improving - allergen avoidance towards grasses, weeds, ragweed, trees, indoor and outdoor molds, dust mites, cat, dog, cockroach - continue allergy injections per protocol, continue to bring your epinephrine auto injector to allergy injection appointments.   - will build up to 0.5 mL of green vial   - try to keep injection appointments at least one week apart  - always take allegra 180 mg prior to coming for injection - start saline spray prior to medicated nasal sprays and as needed.  - start nasacort 1 spray in each nostril twice daily  - continue azelastine 2 sprays in each nostril twice daily as needed. - Continue Allegra 180 mg daily  Reflux: stable - Continue Pepcid 40 mg daily -Lifestyle modifications as below  Mosquito/insect bites-stable Avoidance measures (DEET repellant for  mosquitos, long clothing, etc) If bite occurs with raised rash:  - For itch: Topical steroid (hydrocortisone cream) twice daily as needed + oral antihistamine (zyrtec) - For pain and swelling: Oral anti-inflammatory (ibuprofen), ice affected area        Follow-up in 6 months, sooner if needed It was a pleasure seeing you again in clinic today! Thank you for allowing me to participate in your care. ` Other: none  Tonny Bollman, MD  [facility]

## 2023-10-01 ENCOUNTER — Ambulatory Visit (INDEPENDENT_AMBULATORY_CARE_PROVIDER_SITE_OTHER): Payer: Self-pay

## 2023-10-01 DIAGNOSIS — J309 Allergic rhinitis, unspecified: Secondary | ICD-10-CM | POA: Diagnosis not present

## 2023-10-05 ENCOUNTER — Other Ambulatory Visit: Payer: Self-pay | Admitting: Internal Medicine

## 2023-10-06 ENCOUNTER — Ambulatory Visit (INDEPENDENT_AMBULATORY_CARE_PROVIDER_SITE_OTHER): Payer: Self-pay

## 2023-10-06 DIAGNOSIS — J309 Allergic rhinitis, unspecified: Secondary | ICD-10-CM | POA: Diagnosis not present

## 2023-10-06 MED ORDER — AZELASTINE-FLUTICASONE 137-50 MCG/ACT NA SUSP: NASAL | 1 refills | Status: DC

## 2023-10-12 ENCOUNTER — Ambulatory Visit (INDEPENDENT_AMBULATORY_CARE_PROVIDER_SITE_OTHER): Payer: Self-pay

## 2023-10-12 DIAGNOSIS — J309 Allergic rhinitis, unspecified: Secondary | ICD-10-CM

## 2023-10-27 ENCOUNTER — Ambulatory Visit (INDEPENDENT_AMBULATORY_CARE_PROVIDER_SITE_OTHER): Payer: Self-pay

## 2023-10-27 DIAGNOSIS — J309 Allergic rhinitis, unspecified: Secondary | ICD-10-CM

## 2023-11-05 ENCOUNTER — Ambulatory Visit (INDEPENDENT_AMBULATORY_CARE_PROVIDER_SITE_OTHER): Payer: Self-pay

## 2023-11-05 DIAGNOSIS — J309 Allergic rhinitis, unspecified: Secondary | ICD-10-CM | POA: Diagnosis not present

## 2023-11-11 ENCOUNTER — Ambulatory Visit (INDEPENDENT_AMBULATORY_CARE_PROVIDER_SITE_OTHER)

## 2023-11-11 DIAGNOSIS — J309 Allergic rhinitis, unspecified: Secondary | ICD-10-CM

## 2023-11-24 ENCOUNTER — Ambulatory Visit (INDEPENDENT_AMBULATORY_CARE_PROVIDER_SITE_OTHER): Payer: Self-pay

## 2023-11-24 DIAGNOSIS — J309 Allergic rhinitis, unspecified: Secondary | ICD-10-CM

## 2024-02-05 IMAGING — MG DIGITAL SCREENING BREAST BILAT IMPLANT W/ TOMO W/ CAD
8 of 12 series · 8 of 28 positions shown · non-contrast
Comparison: Previous exam(s).

CLINICAL DATA: Screening.

EXAM:
DIGITAL SCREENING BILATERAL MAMMOGRAM WITH IMPLANTS, CAD AND
TOMOSYNTHESIS
TECHNIQUE: Bilateral screening digital craniocaudal and mediolateral oblique
mammograms were obtained. Bilateral screening digital breast
tomosynthesis was performed. The images were evaluated with
computer-aided detection. Standard and/or implant displaced views
were performed.

[R MLO]
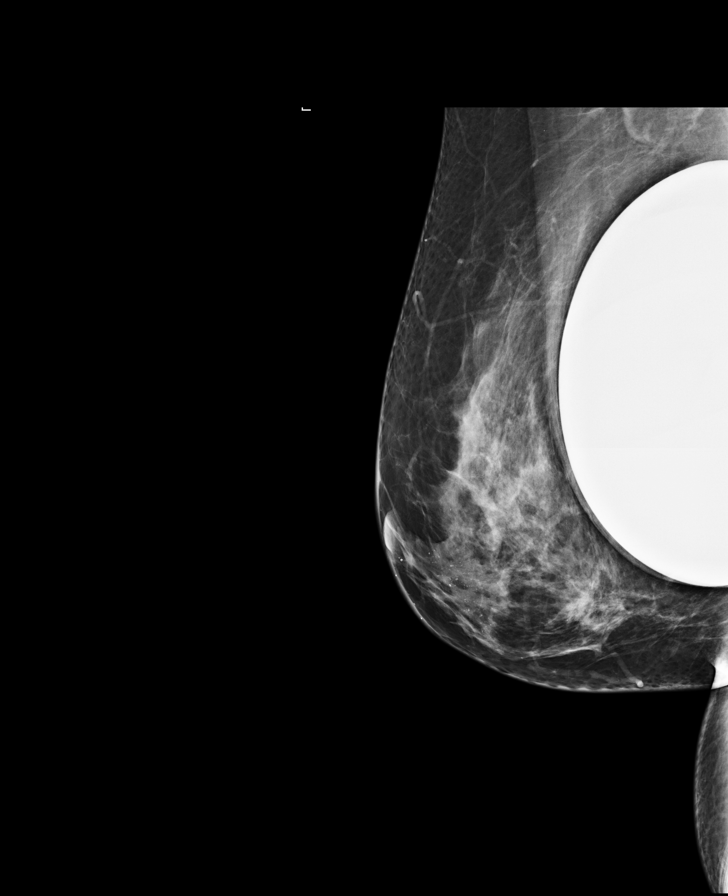

[R CC]
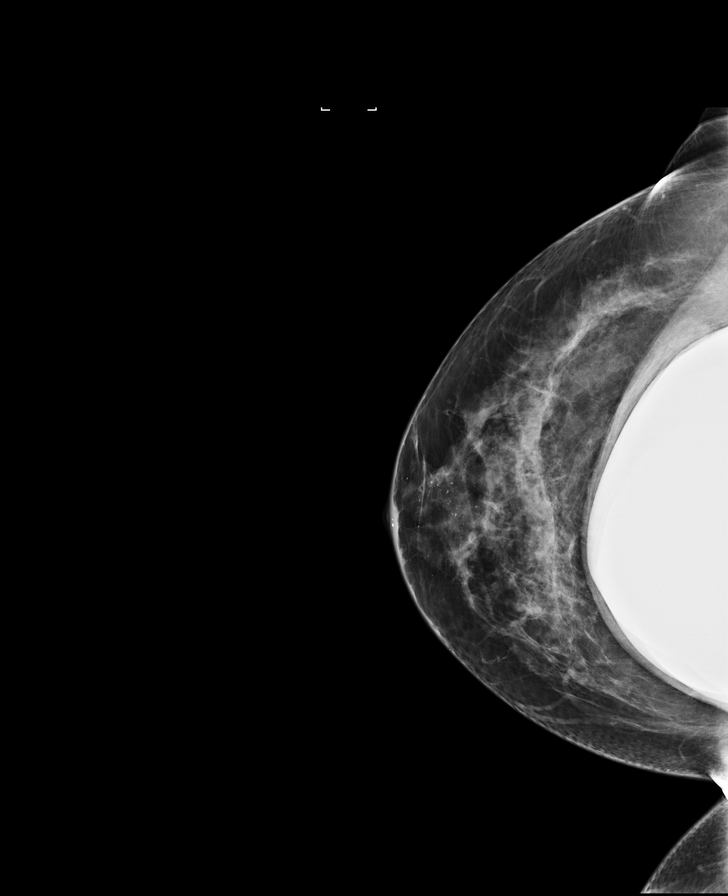

[L CC]
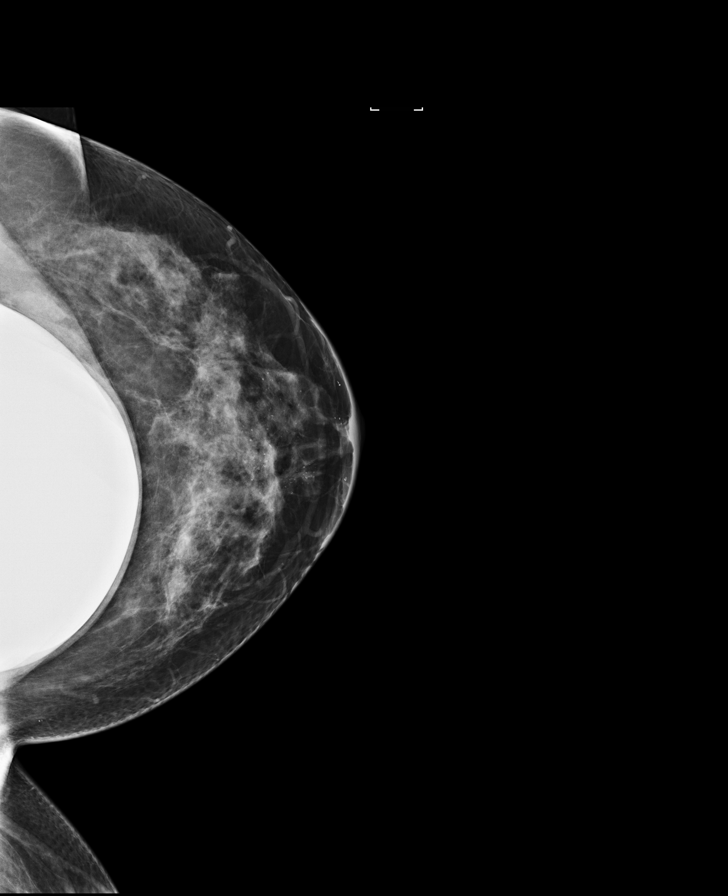

[L MLO]
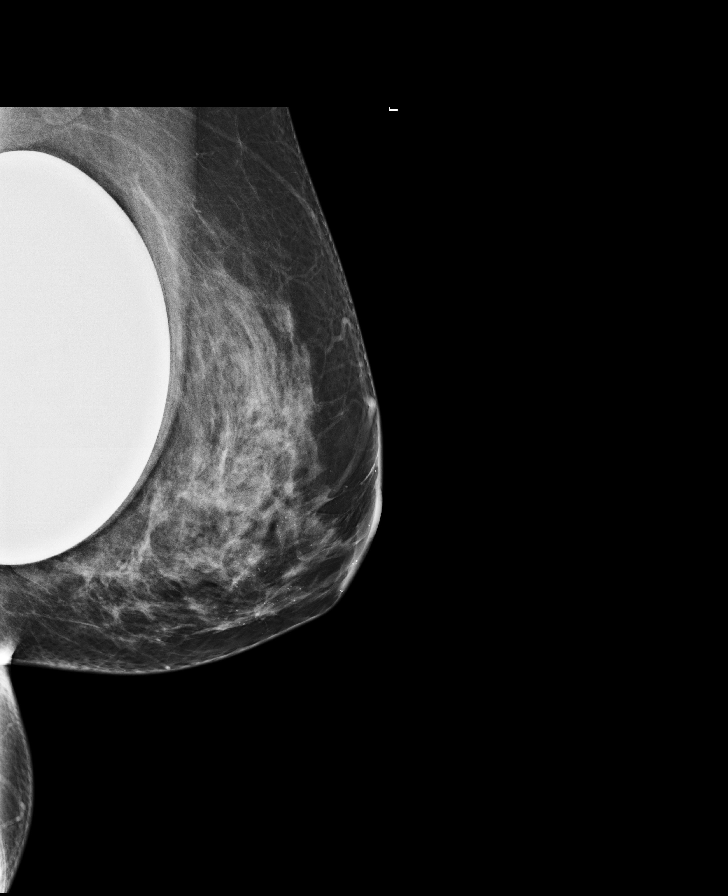

[R CC synth-2D]
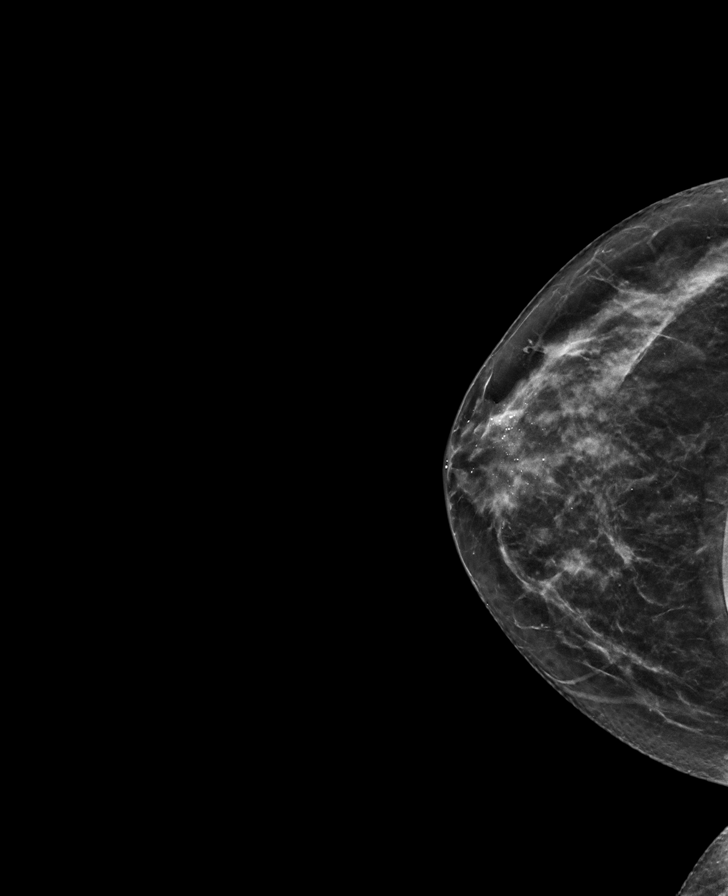

[L MLO synth-2D]
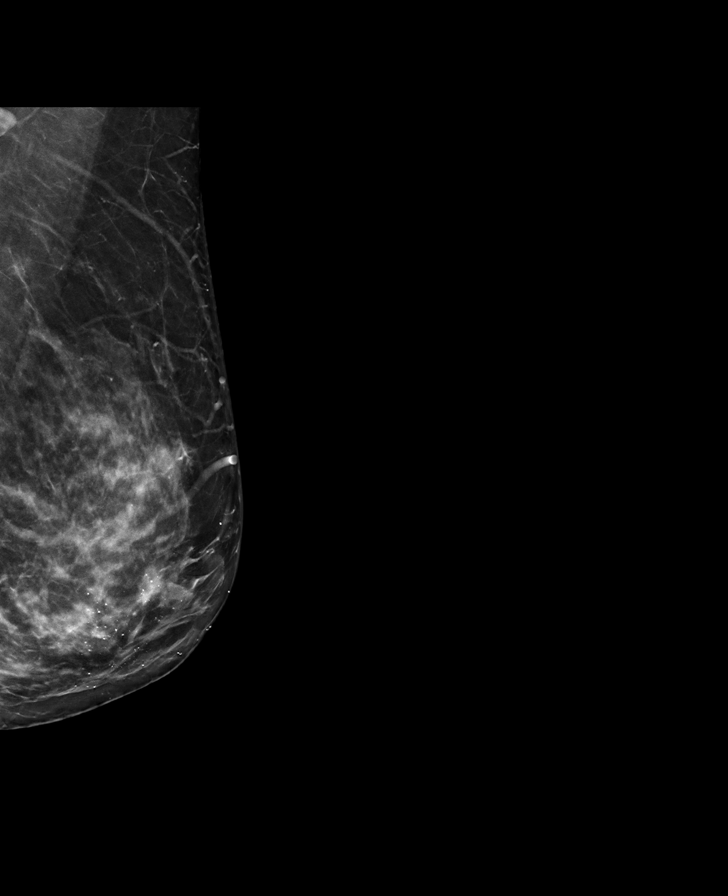

[R MLO synth-2D]
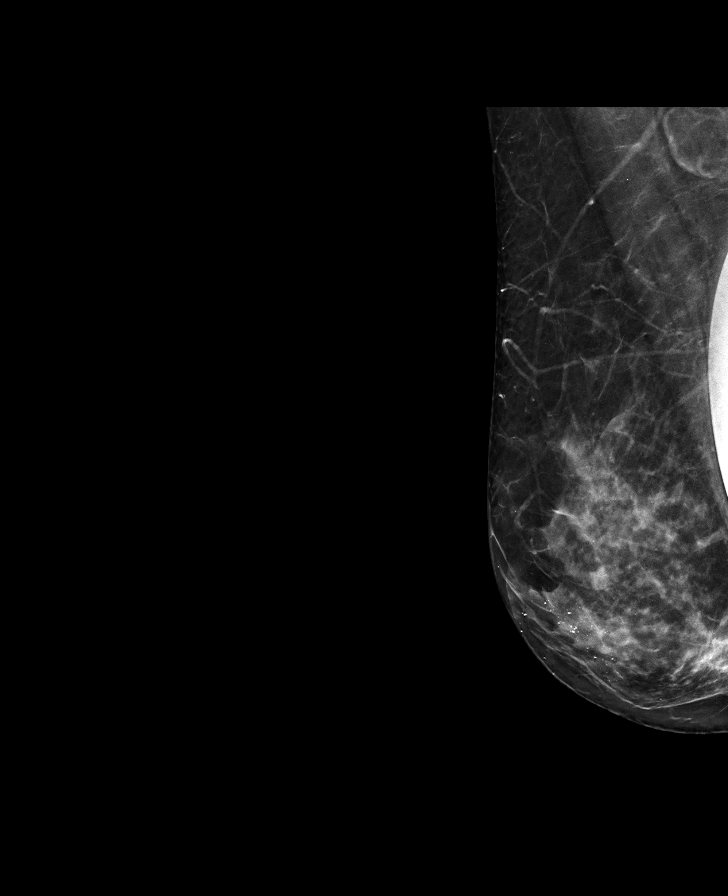

[L CC synth-2D]
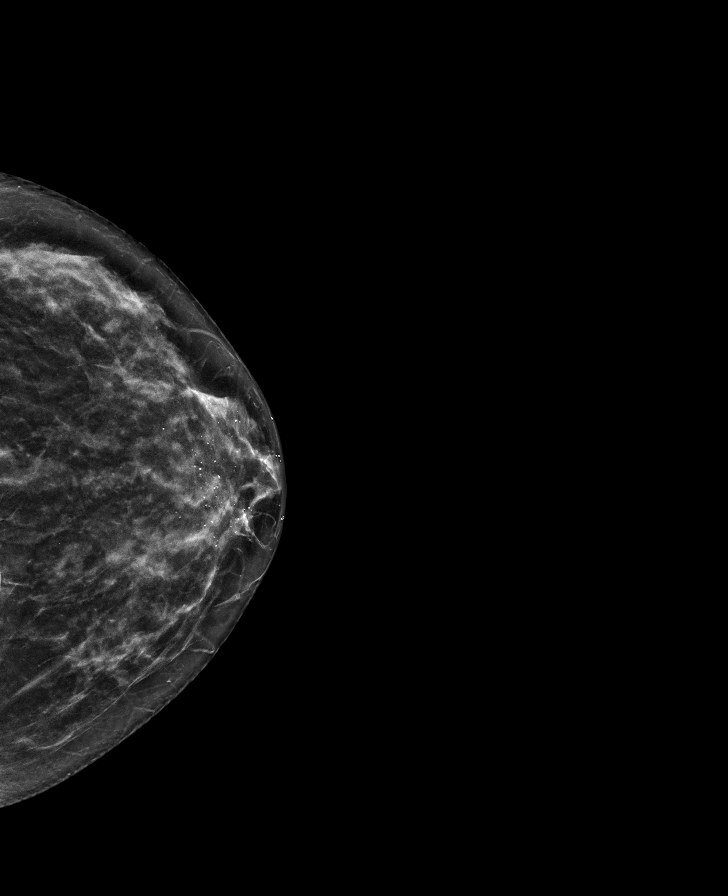

[8 of 28 positions shown; findings below may reference images not displayed]

ACR Breast Density Category c: The breast tissue is heterogeneously
dense, which may obscure small masses.
FINDINGS: The patient has retropectoral implants. There are no findings
suspicious for malignancy.
IMPRESSION: No mammographic evidence of malignancy. A result letter of this
screening mammogram will be mailed directly to the patient.

RECOMMENDATION:
Screening mammogram in one year. (Code:LT-E-7TH)

BI-RADS CATEGORY  1:  Negative.

## 2024-02-17 ENCOUNTER — Ambulatory Visit: Admitting: Internal Medicine

## 2024-02-17 ENCOUNTER — Other Ambulatory Visit: Payer: Self-pay

## 2024-02-17 ENCOUNTER — Encounter: Payer: Self-pay | Admitting: Internal Medicine

## 2024-02-17 VITALS — BP 128/70 | HR 80 | Temp 98.2°F | Resp 18 | Wt 160.7 lb

## 2024-02-17 DIAGNOSIS — K219 Gastro-esophageal reflux disease without esophagitis: Secondary | ICD-10-CM

## 2024-02-17 DIAGNOSIS — J3089 Other allergic rhinitis: Secondary | ICD-10-CM

## 2024-02-17 DIAGNOSIS — J302 Other seasonal allergic rhinitis: Secondary | ICD-10-CM

## 2024-02-17 MED ORDER — AZELASTINE-FLUTICASONE 137-50 MCG/ACT NA SUSP: NASAL | 1 refills | Status: AC

## 2024-02-17 MED ORDER — EPINEPHRINE 0.3 MG/0.3ML IJ SOAJ: 0.3000 mg | INTRAMUSCULAR | 1 refills | Status: AC | PRN

## 2024-02-17 NOTE — Progress Notes (Signed)
 FOLLOW UP Date of Service/Encounter:   02/17/2024  Subjective:  Lori Richmond (DOB: [birth year]) is a 53 y.o. female who [facility] in re-evaluation of the following: Chronic cough suspected upper airway, allergic rhinitis on AIT  History obtained from: chart review and patient.  For Review, LV was on 09/21/23  with Dr.Tawnia Schirm seen for routine follow-up. See below for summary of history and diagnostics.   Therapeutic plans/changes recommended: Doing well on green vial with plan to stay at 0.5 mL every 2 weeks.  Planning for a summer vacation lasting 2 months. ----------------------------------------------------- Pertinent History/Diagnostics:  Chronic cough- suspected upper airway cough, taking pepcid  40 mg daily. Cough improved with pepcid  - 09/19/21 spirometry-90% ratio, with 70% FEV1 without improvement postbronchodilator; Allergic Rhinitis:  year round sore throat, nasal congestion; deviated septum.  Spring and winter tend to be worse seasons. - SPT environmental panel (09/19/21): positive to grasses, weeds, ragweed, trees, indoor and outdoor molds, dust mites, cat, dog, cockroach - started AIT on 10/16/21: vial 1 (G-RW-W-T-dog) and vial 2 (molds, cat, cockroach, DM) January 2020 for frozen at 0.05 mL of red vial due to hives and large local reactions. --------------------------------------------------- Today presents for follow-up. Discussed the use of AI scribe software for clinical note transcription with the patient, who gave verbal consent to proceed.  History of Present Illness Lori Richmond is a 53 year old female who presents for resumption of allergy  shots.  Allergic rhinitis symptoms and immunotherapy - Allergy  shots not received for the past three months - Allergy  shots previously provided very effective symptom control - During the lapse, daily use of moisturizing saline nasal spray and Nasacort or Flonase, rotating between them  to avoid overuse - Occasional use of Allegra with satisfactory symptom control - Requests renewal of azelastine /fluticasone  combination nasal spray prescription due to its effectiveness for congestion - Carries an EpiPen  in her car, especially when attending allergy  shot appointments  Ocular floaters and retinal tears - History of retinal tears in both eyes, treated with laser surgery twice - Current stability of retinal condition with persistent floaters in both eyes - Ongoing ophthalmologic monitoring - No further procedures recommended for floaters due to procedural risks  Chronic back pain and physical therapy - Undergoing physical therapy for back issues since January - Significant improvement in back symptoms - No exacerbation of back pain during recent travel   All medications reviewed by clinical staff and updated in chart. No new pertinent medical or surgical history except as noted in HPI.  ROS: All others negative except as noted per HPI.   Objective:  BP 128/70   Pulse 80   Temp 98.2 F (36.8 C) (Temporal)   Resp 18   Wt 160 lb 11.2 oz (72.9 kg)   SpO2 99%   BMI 28.47 kg/m  Body mass index is 28.47 kg/m. Physical Exam: General Appearance:  Alert, cooperative, no distress, appears stated age  Head:  Normocephalic, without obvious abnormality, atraumatic  Eyes:  Conjunctiva clear, EOM's intact  Ears EACs normal bilaterally and normal TMs bilaterally  Nose: Nares normal, hypertrophic turbinates, normal mucosa, and no visible anterior polyps  Throat: Lips, tongue normal; teeth and gums normal, normal posterior oropharynx  Neck: Supple, symmetrical  Lungs:   clear to auscultation bilaterally, Respirations unlabored, no coughing  Heart:  regular rate and rhythm and no murmur, Appears well perfused  Extremities: No edema  Skin: Skin color, texture, turgor normal and no rashes or lesions on visualized  portions of skin  Neurologic: No gross deficits   Labs:  Lab  Orders  No laboratory test(s) ordered today   Assessment/Plan   Seasonal and perennial allergic rhinitis-at goal despite break in AIT - allergen avoidance towards grasses, weeds, ragweed, trees, indoor and outdoor molds, dust mites, cat, dog, cockroach - consider restarting allergy  injections, would start at 0.025 mL of yellow vial and build back up to maintenance dose of 0.5 mL green vial - continue saline spray prior to medicated nasal sprays and as needed.  - continue nasacort 1 spray in each nostril twice daily  - continue azelastine  2 sprays in each nostril twice daily as needed. OR you can use Dymista  (azelastine -flonase) 1 spray twice daily as needed (this replaces nasacort or flonase and azelastine ) - Continue Allegra 180 mg daily as needed  Reflux: - Continue Pepcid  40 mg daily as needed.  -Lifestyle modifications as below  Mosquito/insect bites Avoidance measures (DEET repellant for mosquitos, long clothing, etc) If bite occurs with raised rash:  - For itch: Topical steroid (hydrocortisone  cream) twice daily as needed + oral antihistamine (zyrtec ) - For pain and swelling: Oral anti-inflammatory (ibuprofen), ice affected area       Follow-up in 6 months, sooner if needed It was a pleasure seeing you again in clinic today! Thank you for allowing me to participate in your care.  Other: none  Rocky Endow, MD  [facility] 

## 2024-02-17 NOTE — Patient Instructions (Addendum)
 Seasonal and perennial allergic rhinitis-at goal despite break in AIT - allergen avoidance towards grasses, weeds, ragweed, trees, indoor and outdoor molds, dust mites, cat, dog, cockroach - consider restarting allergy  injections, would start at 0.025 mL of yellow vial and build back up to maintenance dose of 0.5 mL green vial - continue saline spray prior to medicated nasal sprays and as needed.  - continue nasacort 1 spray in each nostril twice daily  - continue azelastine  2 sprays in each nostril twice daily as needed. OR you can use Dymista  (azelastine -flonase) 1 spray twice daily as needed (this replaces nasacort or flonase and azelastine ) - Continue Allegra 180 mg daily as needed  Reflux: - Continue Pepcid  40 mg daily as needed.  -Lifestyle modifications as below  Mosquito/insect bites Avoidance measures (DEET repellant for mosquitos, long clothing, etc) If bite occurs with raised rash:  - For itch: Topical steroid (hydrocortisone  cream) twice daily as needed + oral antihistamine (zyrtec ) - For pain and swelling: Oral anti-inflammatory (ibuprofen), ice affected area       Follow-up in 6 months, sooner if needed It was a pleasure seeing you again in clinic today! Thank you for allowing me to participate in your care.  Rocky Endow, MD [facility]    It was a pleasure seeing you today.  Gastroesophageal Reflux Induced Respiratory Disease and Laryngopharyngeal Reflux (LPR): Gastroesophageal reflux disease (GERD) is a condition where the contents of the stomach reflux or back up into the esophagus or swallowing tube.  This can result in a variety of clinical symptoms including classic symptoms and atypical symptoms.  Classic symptoms of GERD include: heartburn, chest pain, acid taste in the mouth, and difficulty in swallowing.  Atypical symptoms of GERD include laryngopharyngeal reflux (LPR) and asthma.  LPR occurs when stomach reflux comes all the way up to the  throat.  Clinical symptoms include hoarseness, raspy voice, laryngitis, throat clearing, postnasal drip, mucus stuck in the throat, a sensation of a lump in the throat, sore throat, and cough.  Most patients with LPR do not have classic symptoms of GERD.  Asthma can also be triggered by GERD.  The acid stomach fluid can stimulate nerve fibers in the esophagus which can cause an increase in bronchial muscle tone and narrowing of the airways.  Acid stomach contents may also reflux into the trachea and bronchi of the lungs where it can trigger an asthma attack.  Many people with GERD triggered asthma do not have classic symptoms of GERD.  Diagnosis of LPR and GERD induced asthma is frequently made from a typical history and response to medications.  It may take several months of medications to see a good response.  Occasionally, a 24-hour esophageal pH probe study must be performed.  Treatment of GERD/LPR includes:   Modification of diet and lifestyle Stop smoking Avoid overeating and lose weight Avoid acidic and fatty foods, chocolate, onions, garlic, peppermint Elevate the head of your bed 6 to 8 inches with blocks or wedge Medications Zantac, Pepcid , Axid, Tagamet Prilosec, Prevacid, Aciphex, Protonix, Nexium Surgery     Reducing Pollen Exposure  The American Academy of All[employer] , Asthma and Immun[employer] suggests the following steps to reduce your exposure to pollen during allergy  seasons.    Do not hang sheets or clothing out to dry; pollen may collect on these items. Do not mow lawns or spend time around freshly cut grass; mowing stirs up pollen. Keep windows closed at night.  Keep car windows closed  while driving. Minimize morning activities outdoors, a time when pollen counts are usually at their highest. Stay indoors as much as possible when pollen counts or humidity is high and on windy days when pollen tends to remain in the air longer. Use air conditioning when possible.  Many air  conditioners have filters that trap the pollen spores. Use a HEPA room air filter to remove pollen form the indoor air you breathe. Control of Mold Allergen   Mold and fungi can grow on a variety of surfaces provided certain temperature and moisture conditions exist.  Outdoor molds grow on plants, decaying vegetation and soil.  The major outdoor mold, Alternaria and Cladosporium, are found in very high numbers during hot and dry conditions.  Generally, a late Summer - Fall peak is seen for common outdoor fungal spores.  Rain will temporarily lower outdoor mold spore count, but counts rise rapidly when the rainy period ends.  The most important indoor molds are Aspergillus and Penicillium.  Dark, humid and poorly ventilated basements are ideal sites for mold growth.  The next most common sites of mold growth are the bathroom and the kitchen.  Outdoor (Seasonal) Mold Control  Use air conditioning and keep windows closed Avoid exposure to decaying vegetation. Avoid leaf raking. Avoid grain handling. Consider wearing a face mask if working in moldy areas.    Indoor (Perennial) Mold Control   Maintain humidity below 50%. Clean washable surfaces with 5% bleach solution. Remove sources e.g. contaminated carpets.   Control of Dog or Cat Allergen  Avoidance is the best way to manage a dog or cat allergy . If you have a dog or cat and are allergic to dog or cats, consider removing the dog or cat from the home. If you have a dog or cat but don't want to find it a new home, or if your family wants a pet even though someone in the household is allergic, here are some strategies that may help keep symptoms at bay:  Keep the pet out of your bedroom and restrict it to only a few rooms. Be advised that keeping the dog or cat in only one room will not limit the allergens to that room. Don't pet, hug or kiss the dog or cat; if you do, wash your hands with soap and water. High-efficiency particulate air  (HEPA) cleaners run continuously in a bedroom or living room can reduce allergen levels over time. Regular use of a high-efficiency vacuum cleaner or a central vacuum can reduce allergen levels. Giving your dog or cat a bath at least once a week can reduce airborne allergen. DUST MITE AVOIDANCE MEASURES:  There are three main measures that need and can be taken to avoid house dust mites:  Reduce accumulation of dust in general -reduce furniture, clothing, carpeting, books, stuffed animals, especially in bedroom  Separate yourself from the dust -use pillow and mattress encasements (can be found at stores such as [employer] or online) -avoid direct exposure to air condition flow -use a HEPA filter device, especially in the bedroom; you can also use a HEPA filter vacuum cleaner -wipe dust with a moist towel instead of a dry towel or broom when cleaning  Decrease mites and/or their secretions -wash clothing and linen and stuffed animals at highest temperature possible, at least every 2 weeks -stuffed animals can also be placed in a bag and put in a freezer overnight  Despite the above measures, it is impossible to eliminate dust mites or  their allergen completely from your home.  With the above measures the burden of mites in your home can be diminished, with the goal of minimizing your allergic symptoms.  Success will be reached only when implementing and using all means together. Control of Cockroach Allergen  Cockroach allergen has been identified as an important cause of acute attacks of asthma, especially in urban settings.  There are fifty-five species of cockroach that exist in the [place] , however only three, the [place], [place] and [place] species produce allergen that can affect patients with Asthma.  Allergens can be obtained from fecal particles, egg casings and secretions from cockroaches.    Remove food sources. Reduce access to water. Seal access and entry  points. Spray runways with 0.5-1% Diazinon or Chlorpyrifos Blow boric acid power under stoves and refrigerator. Place bait stations (hydramethylnon) at feeding sites.

## 2024-02-24 ENCOUNTER — Telehealth: Payer: Self-pay | Admitting: Internal Medicine

## 2024-02-24 NOTE — Telephone Encounter (Signed)
 Pt request a call back, she states Isaiah called her

## 2024-03-07 ENCOUNTER — Ambulatory Visit: Admitting: Internal Medicine

## 2024-03-11 ENCOUNTER — Ambulatory Visit

## 2024-03-11 DIAGNOSIS — J309 Allergic rhinitis, unspecified: Secondary | ICD-10-CM | POA: Diagnosis not present

## 2024-03-21 ENCOUNTER — Ambulatory Visit (INDEPENDENT_AMBULATORY_CARE_PROVIDER_SITE_OTHER)

## 2024-03-21 DIAGNOSIS — J309 Allergic rhinitis, unspecified: Secondary | ICD-10-CM | POA: Diagnosis not present

## 2024-03-28 ENCOUNTER — Ambulatory Visit (INDEPENDENT_AMBULATORY_CARE_PROVIDER_SITE_OTHER)

## 2024-03-28 DIAGNOSIS — J309 Allergic rhinitis, unspecified: Secondary | ICD-10-CM

## 2024-04-04 ENCOUNTER — Ambulatory Visit

## 2024-04-04 DIAGNOSIS — J309 Allergic rhinitis, unspecified: Secondary | ICD-10-CM

## 2024-04-14 ENCOUNTER — Ambulatory Visit (INDEPENDENT_AMBULATORY_CARE_PROVIDER_SITE_OTHER)

## 2024-04-14 DIAGNOSIS — J309 Allergic rhinitis, unspecified: Secondary | ICD-10-CM | POA: Diagnosis not present

## 2024-04-25 ENCOUNTER — Ambulatory Visit (INDEPENDENT_AMBULATORY_CARE_PROVIDER_SITE_OTHER)

## 2024-04-25 DIAGNOSIS — J309 Allergic rhinitis, unspecified: Secondary | ICD-10-CM

## 2024-05-03 ENCOUNTER — Ambulatory Visit

## 2024-05-03 DIAGNOSIS — J309 Allergic rhinitis, unspecified: Secondary | ICD-10-CM

## 2024-05-10 ENCOUNTER — Ambulatory Visit (INDEPENDENT_AMBULATORY_CARE_PROVIDER_SITE_OTHER)

## 2024-05-10 DIAGNOSIS — J309 Allergic rhinitis, unspecified: Secondary | ICD-10-CM | POA: Diagnosis not present

## 2024-05-17 ENCOUNTER — Ambulatory Visit (INDEPENDENT_AMBULATORY_CARE_PROVIDER_SITE_OTHER)

## 2024-05-17 DIAGNOSIS — J309 Allergic rhinitis, unspecified: Secondary | ICD-10-CM | POA: Diagnosis not present

## 2024-05-24 ENCOUNTER — Ambulatory Visit (INDEPENDENT_AMBULATORY_CARE_PROVIDER_SITE_OTHER)

## 2024-05-24 DIAGNOSIS — J309 Allergic rhinitis, unspecified: Secondary | ICD-10-CM | POA: Diagnosis not present

## 2024-06-02 ENCOUNTER — Ambulatory Visit

## 2024-06-02 DIAGNOSIS — J309 Allergic rhinitis, unspecified: Secondary | ICD-10-CM | POA: Diagnosis not present

## 2024-06-07 ENCOUNTER — Ambulatory Visit

## 2024-06-07 DIAGNOSIS — J309 Allergic rhinitis, unspecified: Secondary | ICD-10-CM | POA: Diagnosis not present

## 2024-06-09 DIAGNOSIS — J301 Allergic rhinitis due to pollen: Secondary | ICD-10-CM | POA: Diagnosis not present

## 2024-06-09 DIAGNOSIS — J302 Other seasonal allergic rhinitis: Secondary | ICD-10-CM | POA: Diagnosis not present

## 2024-06-09 DIAGNOSIS — J3081 Allergic rhinitis due to animal (cat) (dog) hair and dander: Secondary | ICD-10-CM | POA: Diagnosis not present

## 2024-06-09 DIAGNOSIS — J3089 Other allergic rhinitis: Secondary | ICD-10-CM | POA: Diagnosis not present

## 2024-06-09 NOTE — Progress Notes (Signed)
 VIALS MADE ON 06/09/24

## 2024-06-14 ENCOUNTER — Ambulatory Visit

## 2024-06-14 DIAGNOSIS — J309 Allergic rhinitis, unspecified: Secondary | ICD-10-CM

## 2024-06-28 ENCOUNTER — Ambulatory Visit (INDEPENDENT_AMBULATORY_CARE_PROVIDER_SITE_OTHER): Admitting: *Deleted

## 2024-06-28 DIAGNOSIS — J309 Allergic rhinitis, unspecified: Secondary | ICD-10-CM

## 2024-07-07 ENCOUNTER — Ambulatory Visit (INDEPENDENT_AMBULATORY_CARE_PROVIDER_SITE_OTHER)

## 2024-07-07 DIAGNOSIS — J302 Other seasonal allergic rhinitis: Secondary | ICD-10-CM

## 2024-07-12 ENCOUNTER — Ambulatory Visit

## 2024-07-12 DIAGNOSIS — J302 Other seasonal allergic rhinitis: Secondary | ICD-10-CM

## 2024-07-20 ENCOUNTER — Ambulatory Visit

## 2024-07-20 DIAGNOSIS — J302 Other seasonal allergic rhinitis: Secondary | ICD-10-CM | POA: Diagnosis not present

## 2024-08-03 ENCOUNTER — Ambulatory Visit

## 2024-08-03 DIAGNOSIS — J302 Other seasonal allergic rhinitis: Secondary | ICD-10-CM | POA: Diagnosis not present

## 2024-08-22 ENCOUNTER — Ambulatory Visit: Admitting: Internal Medicine
